# Patient Record
Sex: Female | Born: 1960 | Race: White | Hispanic: No | Marital: Married | Smoking: Former smoker
Health system: Southern US, Community
[De-identification: ages and names within clinical notes are randomized; demographics above are authoritative.]

## PROBLEM LIST (undated history)

## (undated) DIAGNOSIS — N951 Menopausal and female climacteric states: Secondary | ICD-10-CM

## (undated) DIAGNOSIS — I10 Essential (primary) hypertension: Secondary | ICD-10-CM

## (undated) DIAGNOSIS — N3000 Acute cystitis without hematuria: Secondary | ICD-10-CM

## (undated) DIAGNOSIS — E079 Disorder of thyroid, unspecified: Secondary | ICD-10-CM

## (undated) HISTORY — PX: ABDOMINAL HYSTERECTOMY: SHX81

## (undated) HISTORY — PX: BREAST SURGERY: SHX581

## (undated) HISTORY — DX: Menopausal and female climacteric states: N95.1

## (undated) HISTORY — DX: Acute cystitis without hematuria: N30.00

## (undated) HISTORY — DX: Essential (primary) hypertension: I10

## (undated) HISTORY — PX: ENDOMETRIAL ABLATION: SHX621

## (undated) HISTORY — PX: TUBAL LIGATION: SHX77

---

## 1999-09-06 ENCOUNTER — Other Ambulatory Visit: Admission: RE | Admit: 1999-09-06 | Discharge: 1999-09-06 | Payer: Self-pay | Admitting: Obstetrics and Gynecology

## 2002-06-05 HISTORY — PX: AUGMENTATION MAMMAPLASTY: SUR837

## 2002-06-19 ENCOUNTER — Other Ambulatory Visit: Admission: RE | Admit: 2002-06-19 | Discharge: 2002-06-19 | Payer: Self-pay | Admitting: Obstetrics and Gynecology

## 2003-08-27 ENCOUNTER — Other Ambulatory Visit: Admission: RE | Admit: 2003-08-27 | Discharge: 2003-08-27 | Payer: Self-pay | Admitting: Obstetrics and Gynecology

## 2004-08-29 ENCOUNTER — Other Ambulatory Visit: Admission: RE | Admit: 2004-08-29 | Discharge: 2004-08-29 | Payer: Self-pay | Admitting: Obstetrics and Gynecology

## 2004-10-07 ENCOUNTER — Encounter (INDEPENDENT_AMBULATORY_CARE_PROVIDER_SITE_OTHER): Payer: Self-pay | Admitting: *Deleted

## 2004-10-07 ENCOUNTER — Ambulatory Visit (HOSPITAL_COMMUNITY): Admission: RE | Admit: 2004-10-07 | Discharge: 2004-10-07 | Payer: Self-pay | Admitting: Obstetrics and Gynecology

## 2008-06-04 ENCOUNTER — Encounter: Admission: RE | Admit: 2008-06-04 | Discharge: 2008-06-04 | Payer: Self-pay | Admitting: Obstetrics and Gynecology

## 2009-08-09 ENCOUNTER — Encounter: Admission: RE | Admit: 2009-08-09 | Discharge: 2009-08-09 | Payer: Self-pay | Admitting: Obstetrics and Gynecology

## 2010-02-23 ENCOUNTER — Emergency Department (HOSPITAL_COMMUNITY): Admission: EM | Admit: 2010-02-23 | Discharge: 2010-02-23 | Payer: Self-pay | Admitting: Emergency Medicine

## 2010-10-21 NOTE — Op Note (Signed)
NAMETALICIA, Cindy Williamson                ACCOUNT NO.:  [id number]   MEDICAL RECORD NO.:  [id number]          PATIENT TYPE:  AMB   LOCATION:  SDC                           FACILITY:  [facility]   PHYSICIAN:  Miguel Aschoff, M.D.       DATE OF BIRTH:  Dec 06, 1960   DATE OF PROCEDURE:  10/07/2004  DATE OF DISCHARGE:                                 OPERATIVE REPORT   PREOPERATIVE DIAGNOSES:  1.  Menorrhagia.  2.  Pelvic pain.  3.  Desires sterilization.   POSTOPERATIVE DIAGNOSES:  1.  Menorrhagia.  2.  Pelvic pain.  3.  Desires sterilization.  4.  Pelvic adhesions.   PROCEDURE:  Cervical dilatation, uterine curettage, hysteroscopy, NovaSure  endometrial ablation, laparoscopy, tubal sterilization, and lysis of  adhesions.   SURGEON:  Miguel Aschoff, M.D.   ANESTHESIA:  General.   COMPLICATIONS:  None.   INDICATIONS FOR PROCEDURE:  The patient is a 50 year old white female with  history of progressively heavier menses which has not responded well to  medical therapy.  In addition, the patient reports persistent right lower  quadrant pain with a negative evaluation.  In addition, she has requested  that sterilization be performed.  Because of her symptomatology, the patient  is being taken to the operating room at this point to undergo hysteroscopy,  D&C, NovaSure endometrial ablation to control heavy bleeding followed by  laparoscopy with tubal sterilization and investigation of the pelvis to see  if an etiology for the pain can be established and corrected.  The risks and  benefits of the procedure were discussed with the patient.   DESCRIPTION OF PROCEDURE:  The patient was taken to the operating room and  placed in the supine position.  General anesthesia was administered without  difficulty.  She was then placed in the dorsal lithotomy position and  prepped and draped in the usual sterile fashion.  The bladder was  catheterized.   A speculum was placed in the vaginal vault.  The anterior  cervical lip was  grasped with a tenaculum, and the cervix was sounded to 9 cm.  The  endocervix was sounded to 4, for a cavity length of 5 cm.  After this was  done, the diagnostic hysteroscope was advanced through the cervix.  No  endocervical lesions were noted.  Investigation of the endometrial cavity  did not reveal any evidence of polyps or submucous myomas.  The hysteroscope  was then removed.  Vigorous curettage was carried out.  A moderate amount of  normal-appearing endometrial curettings were obtained and sent for  histologic study.   At this point, the NovaSure endometrial ablation unit was inserted and set  in position, with a cavity width of 4.4 cm.  Cavity assessment was then made  and was found to be sound.  At this point, a treatment cycle was carried out  for 1 minute and 6 seconds at 121 watts.  After this was done, the NovaSure  ablation unit was removed intact.  A Hulka tenaculum was then placed through  the cervix and held.   Attention was  then directed to the umbilicus.  A small infraumbilical  incision was made.  A Veress needle was inserted.  The abdomen was  insufflated with 3 L of CO2.  Following the insufflation, the trocar to  laparoscope was placed followed by laparoscope itself.  To allow complete  visualization, a 5-mm suprapubic port was established under direct  visualization.  Systematic inspection of the pelvic organs revealed the  uterus to be anterior, normal size and shape.  The left tube and ovary were  noted to be absent due to the patient's prior surgery approximately 22 years  ago.  The patient's right tube was totally within normal limits.  The ovary  was noted to be normal; however, bound down by a thick band of adhesions  pulling it into the cul-de-sac.  This was felt to be the source of the  patient's pain.  The ovary otherwise looked normal, except for this tight  adhesion holding it into the cul-de-sac.  The intestinal surfaces were   inspected and were noted to be within normal limits.  The liver was  inspected and noted to be within normal limits; however, perihepatic  adhesions were noted running from the surface of the liver to just under the  diaphragm.  At this point, the tripolar forceps were introduced.  The right  tube was grasped at its midportion, cauterized, and then divided, with good  separation and good hemostasis.  Then using the tripolar unit again, it was  possible to free the ovary from the cul-de-sac and allow it to return to a  normal position.   At this point, with no other problems being noted, it was elected to  complete the procedure.  There was excellent hemostasis.  The CO2 was  allowed to escape.  All instruments were removed.  The small incisions were  closed using subcuticular 3-0 Vicryl.  The port sites were injected with  0.25% Marcaine.  The estimated blood loss was approximately 10 to 15 cc in  total.  The patient tolerated the procedure well and went to the recovery  room in satisfactory condition.   The plan is for the patient to be discharged home.  Medications for home  will include Tylox one every three hours as needed for pain and doxycycline  100 mg twice a day x3 days.  The patient is to call if there are any  problems such as fever, pain, or heavy bleeding.  She will be seen back in  four weeks for followup examination.      AR/MEDQ  D:  10/07/2004  T:  10/07/2004  Job:  [id number]

## 2010-12-16 ENCOUNTER — Other Ambulatory Visit: Payer: Self-pay | Admitting: Obstetrics and Gynecology

## 2013-12-10 ENCOUNTER — Other Ambulatory Visit: Payer: Self-pay | Admitting: Obstetrics and Gynecology

## 2013-12-11 LAB — CYTOLOGY - PAP

## 2016-07-18 ENCOUNTER — Other Ambulatory Visit: Payer: Self-pay | Admitting: Obstetrics & Gynecology

## 2016-07-19 LAB — CYTOLOGY - PAP

## 2017-01-19 ENCOUNTER — Emergency Department (HOSPITAL_COMMUNITY)
Admission: EM | Admit: 2017-01-19 | Discharge: 2017-01-19 | Disposition: A | Discharge status: Home / Self Care | Payer: 59 | Attending: Emergency Medicine | Admitting: Emergency Medicine

## 2017-01-19 ENCOUNTER — Emergency Department (HOSPITAL_COMMUNITY): Payer: 59

## 2017-01-19 ENCOUNTER — Encounter (HOSPITAL_COMMUNITY): Payer: Self-pay | Admitting: *Deleted

## 2017-01-19 DIAGNOSIS — Y929 Unspecified place or not applicable: Secondary | ICD-10-CM | POA: Insufficient documentation

## 2017-01-19 DIAGNOSIS — W010XXA Fall on same level from slipping, tripping and stumbling without subsequent striking against object, initial encounter: Secondary | ICD-10-CM | POA: Diagnosis not present

## 2017-01-19 DIAGNOSIS — S4991XA Unspecified injury of right shoulder and upper arm, initial encounter: Secondary | ICD-10-CM | POA: Diagnosis present

## 2017-01-19 DIAGNOSIS — F17200 Nicotine dependence, unspecified, uncomplicated: Secondary | ICD-10-CM | POA: Diagnosis not present

## 2017-01-19 DIAGNOSIS — S42291A Other displaced fracture of upper end of right humerus, initial encounter for closed fracture: Secondary | ICD-10-CM

## 2017-01-19 DIAGNOSIS — Y9389 Activity, other specified: Secondary | ICD-10-CM | POA: Diagnosis not present

## 2017-01-19 DIAGNOSIS — S42201A Unspecified fracture of upper end of right humerus, initial encounter for closed fracture: Secondary | ICD-10-CM | POA: Diagnosis not present

## 2017-01-19 DIAGNOSIS — Y998 Other external cause status: Secondary | ICD-10-CM | POA: Insufficient documentation

## 2017-01-19 MED ORDER — NAPROXEN 375 MG PO TABS: 375.0000 mg | ORAL_TABLET | Freq: Two times a day (BID) | ORAL | 0 refills | Status: DC

## 2017-01-19 MED ORDER — HYDROCODONE-ACETAMINOPHEN 5-325 MG PO TABS: 1.0000 | ORAL_TABLET | Freq: Three times a day (TID) | ORAL | 0 refills | Status: AC | PRN

## 2017-01-19 MED ORDER — OXYCODONE-ACETAMINOPHEN 5-325 MG PO TABS
ORAL_TABLET | ORAL | Status: AC
  Filled 2017-01-19: qty 1

## 2017-01-19 MED ORDER — OXYCODONE-ACETAMINOPHEN 5-325 MG PO TABS
1.0000 | ORAL_TABLET | ORAL | Status: DC | PRN
  Administered 2017-01-19: 1 via ORAL

## 2017-01-19 MED ORDER — ACETAMINOPHEN 500 MG PO TABS: 1000.0000 mg | ORAL_TABLET | Freq: Three times a day (TID) | ORAL | 0 refills | Status: AC

## 2017-01-19 NOTE — ED Provider Notes (Signed)
[facility]-EMERGENCY DEPT Provider Note   CSN: [id number] Arrival date & time: 01/19/17  1000     History   Chief Complaint Chief Complaint  Patient presents with  . Shoulder Pain    Dislocated    HPI Cindy Williamson is a 56 y.o. female.  The history is provided by the patient.  Shoulder Pain   This is a new problem.   Pain began last night after a mechanical fall. Patient reports that she was walking her dog with her daughter noted a fox and started chasing her, pulling her forward and he can on her shoulder. Patient fell forward, noting immediate pain to the right shoulder. Denied any head trauma, facial trauma, loss of consciousness. No other acute injuries during the episode. It is exacerbated with palpation of the right shoulder and range of motion. Alleviated by Aleve. No other alleviating or aggravating factors. Patient denies any other physical complaints.   History reviewed. No pertinent past medical history.  There are no active problems to display for this patient.   Past Surgical History:  Procedure Laterality Date  . ABDOMINAL HYSTERECTOMY    . BREAST SURGERY      OB History    No data available       Home Medications    Prior to Admission medications   Medication Sig Start Date End Date Taking? Authorizing Provider  acetaminophen (TYLENOL) 500 MG tablet Take 2 tablets (1,000 mg total) by mouth every 8 (eight) hours. Do not take more than 4000 mg of acetaminophen (Tylenol) in a 24-hour period. Please note that other medicines that you may be prescribed may have Tylenol as well. 01/19/17 01/24/17  Nira Conn, MD  HYDROcodone-acetaminophen (NORCO/VICODIN) 5-325 MG tablet Take 1 tablet by mouth every 8 (eight) hours as needed for severe pain (That is not improved by your scheduled acetaminophen regimen). Please do not exceed 4000 mg of acetaminophen (Tylenol) a 24-hour period. Please note that he may be prescribed additional medicine that contains  acetaminophen. 01/19/17 01/24/17  Nira Conn, MD  naproxen (NAPROSYN) 375 MG tablet Take 1 tablet (375 mg total) by mouth 2 (two) times daily. 01/19/17   Nira Conn, MD    Family History No family history on file.  Social History Social History  Substance Use Topics  . Smoking status: Current Every Day Smoker  . Smokeless tobacco: Never Used  . Alcohol use Not on file     Comment: occ     Allergies   Patient has no allergy information on record.   Review of Systems Review of Systems  All other systems are reviewed and are negative for acute change except as noted in the HPI  Physical Exam Updated Vital Signs BP (!) 161/91 (BP Location: Right Arm)   Pulse 78   Temp 98.4 F (36.9 C) (Oral)   Resp 18   SpO2 98%   Physical Exam  Constitutional: She is oriented to person, place, and time. She appears well-developed and well-nourished. No distress.  HENT:  Head: Normocephalic and atraumatic.  Right Ear: External ear normal.  Left Ear: External ear normal.  Nose: Nose normal.  Eyes: Conjunctivae and EOM are normal. No scleral icterus.  Neck: Normal range of motion and phonation normal.  Cardiovascular: Normal rate and regular rhythm.   Pulmonary/Chest: Effort normal. No stridor. No respiratory distress.  Abdominal: She exhibits no distension.  Musculoskeletal: Normal range of motion. She exhibits no edema.       Right shoulder: She  exhibits tenderness and bony tenderness. She exhibits no swelling, no crepitus, no deformity, normal pulse and normal strength.  Neurological: She is alert and oriented to person, place, and time.  Skin: She is not diaphoretic.  Psychiatric: She has a normal mood and affect. Her behavior is normal.  Vitals reviewed.    ED Treatments / Results  Labs (all labs ordered are listed, but only abnormal results are displayed) Labs Reviewed - No data to display  EKG  EKG Interpretation None       Radiology Dg  Shoulder Right  Result Date: 01/19/2017 CLINICAL DATA:  Right shoulder pain secondary to a fall last night. EXAM: RIGHT SHOULDER - 2+ VIEW COMPARISON:  None. FINDINGS: There is an avulsion fracture of the greater tuberosity of the proximal right humerus with minimal displacement. No dislocation. Soft tissues appear normal. IMPRESSION: Minimally distracted fracture of the greater tuberosity of the proximal right humerus. Electronically Signed   By: Francene Boyers M.D.   On: 01/19/2017 12:48    Procedures Procedures (including critical care time)  Medications Ordered in ED Medications  oxyCODONE-acetaminophen (PERCOCET/ROXICET) 5-325 MG per tablet 1 tablet (1 tablet Oral Given 01/19/17 1154)  oxyCODONE-acetaminophen (PERCOCET/ROXICET) 5-325 MG per tablet (not administered)     Initial Impression / Assessment and Plan / ED Course  I have reviewed the triage vital signs and the nursing notes.  Pertinent labs & imaging results that were available during my care of the patient were reviewed by me and considered in my medical decision making (see chart for details).     Plain film noted a distraction injury of the right femoral head. Neurovascular intact distally. Sling applied. Patient requested follow up with [facility] orthopedic and was provided information.  The patient is safe for discharge with strict return precautions.   Final Clinical Impressions(s) / ED Diagnoses   Final diagnoses:  Closed fracture of head of right humerus, initial encounter   Disposition: Discharge  Condition: Good  I have discussed the results, Dx and Tx plan with the patient who expressed understanding and agree(s) with the plan. Discharge instructions discussed at great length. The patient was given strict return precautions who verbalized understanding of the instructions. No further questions at time of discharge.    New Prescriptions   ACETAMINOPHEN (TYLENOL) 500 MG TABLET    Take 2 tablets (1,000 mg  total) by mouth every 8 (eight) hours. Do not take more than 4000 mg of acetaminophen (Tylenol) in a 24-hour period. Please note that other medicines that you may be prescribed may have Tylenol as well.   HYDROCODONE-ACETAMINOPHEN (NORCO/VICODIN) 5-325 MG TABLET    Take 1 tablet by mouth every 8 (eight) hours as needed for severe pain (That is not improved by your scheduled acetaminophen regimen). Please do not exceed 4000 mg of acetaminophen (Tylenol) a 24-hour period. Please note that he may be prescribed additional medicine that contains acetaminophen.   NAPROXEN (NAPROSYN) 375 MG TABLET    Take 1 tablet (375 mg total) by mouth 2 (two) times daily.    Follow Up: [facility] [address] [place] [address] [place] [address] [phone]  Schedule an appointment as soon as possible for a visit in 1 week For close follow up to assess for right shoulder fracture      Dodd Schmid, Amadeo Garnet, MD 01/19/17 1436

## 2017-01-19 NOTE — ED Triage Notes (Signed)
Pt was walking dog last night and dog took off and she landed with right arm straight out on ground.  Now with pain and possible dislocation to right shoulder area.

## 2018-07-18 DIAGNOSIS — N3000 Acute cystitis without hematuria: Secondary | ICD-10-CM | POA: Insufficient documentation

## 2018-07-18 DIAGNOSIS — I10 Essential (primary) hypertension: Secondary | ICD-10-CM

## 2018-07-18 DIAGNOSIS — N951 Menopausal and female climacteric states: Secondary | ICD-10-CM | POA: Insufficient documentation

## 2018-07-18 NOTE — Progress Notes (Signed)
Cardiology Office Note   Date:  07/19/2018   ID:  Cindy Williamson, DOB Aug 10, 1960, MRN [id number]  PCP:  Patient, No Pcp Per  Cardiologist:   Annamaria Helling, MD. Referring:   Chief Complaint  Patient presents with  . Hypertension      History of Present Illness: Cindy Williamson is a 58 y.o. female who was referred by Annamaria Helling, MD for evaluation of HTN.  The patient never had high blood pressure until about a year ago she was noted to have this.  In July she was taking phentermine.  She also gained weight.  She was having some problems regulating her thyroid.  Pressure was noted to be 140/80.  However, by January of this year she was not taking this and her blood pressure was still 160/90.  She went back on the phentermine for a short period and now is stopped.  She has been started on Cozaar 50 mg daily.  She wondered if there might be some secondary cause to this.  She started exercising routinely in the fall with her husband.  She is lost weight.  They changed her diet.  She just ordered a blood pressure cuff.  She denies any cardiovascular symptoms. The patient denies any new symptoms such as chest discomfort, neck or arm discomfort. There has been no new shortness of breath, PND or orthopnea. There have been no reported palpitations, presyncope or syncope.    Past Medical History:  Diagnosis Date  . Acute cystitis   . Hypertension   . Menopausal syndrome     Past Surgical History:  Procedure Laterality Date  . ABDOMINAL HYSTERECTOMY    . BREAST SURGERY    . ENDOMETRIAL ABLATION    . TUBAL LIGATION       Current Outpatient Medications  Medication Sig Dispense Refill  . Ibuprofen (ADVIL PO) Take by mouth as needed.    Marland Kitchen losartan (COZAAR) 50 MG tablet Take 50 mg by mouth daily.    Marland Kitchen PARoxetine (PAXIL) 10 MG tablet Take 10 mg by mouth daily.    Marland Kitchen testosterone cypionate (DEPO-TESTOSTERONE) 200 MG/ML injection Inject into the muscle. Inject .25 mL every 4 weeks.    Marland Kitchen thyroid  (ARMOUR) 60 MG tablet Take 60 mg by mouth daily before breakfast.    . VITAMIN D PO Take by mouth.    . phentermine 37.5 MG capsule Take 37.5 mg by mouth every morning.     No current facility-administered medications for this visit.     Allergies:   Patient has no known allergies.    Social History:  The patient  reports that she quit smoking about 14 months ago. Her smoking use included cigarettes. She quit after 5.00 years of use. She has never used smokeless tobacco. She reports that she does not use drugs.   Family History:  The patient's family history includes Atrial fibrillation in her brother; COPD in her father.    ROS:  Please see the history of present illness.   Otherwise, review of systems are positive for none.   All other systems are reviewed and negative.    PHYSICAL EXAM: VS:  BP 122/80 (BP Location: Left Arm, Patient Position: Sitting, Cuff Size: Normal)   Pulse 64   Ht 5\' 5"  (1.651 m)   Wt 169 lb 6.4 oz (76.8 kg)   BMI 28.19 kg/m  , BMI Body mass index is 28.19 kg/m. GENERAL:  Well appearing HEENT:  Pupils equal round and reactive, fundi not  visualized, oral mucosa unremarkable NECK:  No jugular venous distention, waveform within normal limits, carotid upstroke brisk and symmetric, no bruits, no thyromegaly LYMPHATICS:  No cervical, inguinal adenopathy LUNGS:  Clear to auscultation bilaterally BACK:  No CVA tenderness CHEST:  Unremarkable HEART:  PMI not displaced or sustained,S1 and S2 within normal limits, no S3, no S4, no clicks, no rubs, no murmurs ABD:  Flat, positive bowel sounds normal in frequency in pitch, no bruits, no rebound, no guarding, no midline pulsatile mass, no hepatomegaly, no splenomegaly EXT:  2 plus pulses throughout, no edema, no cyanosis no clubbing SKIN:  No rashes no nodules NEURO:  Cranial nerves II through XII grossly intact, motor grossly intact throughout PSYCH:  Cognitively intact, oriented to person place and time    EKG:   EKG is ordered today. The ekg ordered today demonstrates sinus rhythm, rate 64, axis within normal limits, intervals within normal limits, no acute ST-T wave changes.   Recent Labs: No results found for requested labs within last 8760 hours.    Lipid Panel No results found for: CHOL, TRIG, HDL, CHOLHDL, VLDL, LDLCALC, LDLDIRECT    Wt Readings from Last 3 Encounters:  07/19/18 169 lb 6.4 oz (76.8 kg)      Other studies Reviewed: Additional studies/ records that were reviewed today include: Labs, office records. Review of the above records demonstrates:  Please see elsewhere in the note.     ASSESSMENT AND PLAN:  HTN: Her blood pressure is well controlled today.  I think her pressure will come down as she gets her weight down and increases her exercise.  She may even be able to back off on her Cozaar.  I do not suspect a secondary etiology.  Thyroid has been normal.  Electrolytes and creatinine have been normal.  She is going to start keeping a blood pressure diary and if her blood pressures remain low we will try backing off on her Cozaar.  She understands that she might chronically need an antihypertensive.  I told her I would be happy to follow-up any blood pressure diary readings should she want to send these to me.  OVERWEIGHT: She is done a good job losing weight.  We talked at length about diet and exercise.   Current medicines are reviewed at length with the patient today.  The patient does not have concerns regarding medicines.  The following changes have been made:  no change  Labs/ tests ordered today include:   Orders Placed This Encounter  Procedures  . EKG 12-Lead     Disposition:   FU with me as needed.      Signed, Rollene RotundaJames Barnell Shieh, MD  07/19/2018 11:10 AM    [facility]

## 2018-07-19 ENCOUNTER — Ambulatory Visit: Payer: 59 | Admitting: Cardiology

## 2018-07-19 ENCOUNTER — Encounter: Payer: Self-pay | Admitting: Cardiology

## 2018-07-19 VITALS — BP 122/80 | HR 64 | Ht 65.0 in | Wt 169.4 lb

## 2018-07-19 DIAGNOSIS — I10 Essential (primary) hypertension: Secondary | ICD-10-CM

## 2018-07-19 NOTE — Patient Instructions (Addendum)
Medication Instructions:  Continue current medications  If you need a refill on your cardiac medications before your next appointment, please call your pharmacy.  Labwork: None Ordered   Take the provided lab slips with you to the lab for your blood draw.   When you have your labs (blood work) drawn today and your tests are completely normal, you will receive your results only by MyChart Message (if you have MyChart) -OR-  A paper copy in the mail.  If you have any lab test that is abnormal or we need to change your treatment, we will call you to review these results.  Testing/Procedures: None Ordered  Follow-Up: . Your physician recommends that you schedule a follow-up appointment in: As Needed   At [facility], you and your health needs are our priority.  As part of our continuing mission to provide you with exceptional heart care, we have created designated Provider Care Teams.  These Care Teams include your primary Cardiologist (physician) and Advanced Practice Providers (APPs -  Physician Assistants and Nurse Practitioners) who all work together to provide you with the care you need, when you need it.  Thank you for choosing [facility] at [facility]!!    Fulton Reek, MD [phone]

## 2019-07-03 ENCOUNTER — Other Ambulatory Visit: Payer: Self-pay | Admitting: Obstetrics & Gynecology

## 2019-07-03 DIAGNOSIS — N644 Mastodynia: Secondary | ICD-10-CM

## 2019-07-16 ENCOUNTER — Ambulatory Visit
Admission: RE | Admit: 2019-07-16 | Discharge: 2019-07-16 | Disposition: A | Discharge status: Home / Self Care | Payer: 59 | Source: Ambulatory Visit | Attending: Obstetrics & Gynecology | Admitting: Obstetrics & Gynecology

## 2019-07-16 ENCOUNTER — Other Ambulatory Visit: Payer: Self-pay | Admitting: Obstetrics & Gynecology

## 2019-07-16 ENCOUNTER — Other Ambulatory Visit: Payer: Self-pay

## 2019-07-16 DIAGNOSIS — N644 Mastodynia: Secondary | ICD-10-CM

## 2019-12-11 DIAGNOSIS — Z7189 Other specified counseling: Secondary | ICD-10-CM | POA: Insufficient documentation

## 2019-12-11 DIAGNOSIS — E663 Overweight: Secondary | ICD-10-CM | POA: Insufficient documentation

## 2019-12-11 NOTE — Progress Notes (Deleted)
Cardiology Office Note   Date:  12/11/2019   ID:  [name], DOB 14-Nov-1960, MRN [id number]  PCP:  Patient, No Pcp Per  Cardiologist:   No ref. provider found. Referring:   No chief complaint on file.     History of Present Illness: Cindy Williamson is a 59 y.o. female who was referred for evaluation of HTN.   ***   ***  The patient never had high blood pressure until about a year ago she was noted to have this.  In July she was taking phentermine.  She also gained weight.  She was having some problems regulating her thyroid.  Pressure was noted to be 140/80.  However, by January of this year she was not taking this and her blood pressure was still 160/90.  She went back on the phentermine for a short period and now is stopped.  She has been started on Cozaar 50 mg daily.  She wondered if there might be some secondary cause to this.  She started exercising routinely in the fall with her husband.  She is lost weight.  They changed her diet.  She just ordered a blood pressure cuff.  She denies any cardiovascular symptoms. The patient denies any new symptoms such as chest discomfort, neck or arm discomfort. There has been no new shortness of breath, PND or orthopnea. There have been no reported palpitations, presyncope or syncope.    Past Medical History:  Diagnosis Date  . Acute cystitis   . Hypertension   . Menopausal syndrome     Past Surgical History:  Procedure Laterality Date  . ABDOMINAL HYSTERECTOMY    . AUGMENTATION MAMMAPLASTY Bilateral 2004  . BREAST SURGERY    . ENDOMETRIAL ABLATION    . TUBAL LIGATION       Current Outpatient Medications  Medication Sig Dispense Refill  . Ibuprofen (ADVIL PO) Take by mouth as needed.    Marland Kitchen losartan (COZAAR) 50 MG tablet Take 50 mg by mouth daily.    Marland Kitchen PARoxetine (PAXIL) 10 MG tablet Take 10 mg by mouth daily.    . phentermine 37.5 MG capsule Take 37.5 mg by mouth every morning.    . testosterone cypionate (DEPO-TESTOSTERONE) 200  MG/ML injection Inject into the muscle. Inject .25 mL every 4 weeks.    Marland Kitchen thyroid (ARMOUR) 60 MG tablet Take 60 mg by mouth daily before breakfast.    . VITAMIN D PO Take by mouth.     No current facility-administered medications for this visit.    Allergies:   Patient has no known allergies.    ROS:  Please see the history of present illness.   Otherwise, review of systems are positive for ***.   All other systems are reviewed and negative.    PHYSICAL EXAM: VS:  There were no vitals taken for this visit. , BMI There is no height or weight on file to calculate BMI. GENERAL:  Well appearing NECK:  No jugular venous distention, waveform within normal limits, carotid upstroke brisk and symmetric, no bruits, no thyromegaly LUNGS:  Clear to auscultation bilaterally CHEST:  Unremarkable HEART:  PMI not displaced or sustained,S1 and S2 within normal limits, no S3, no S4, no clicks, no rubs, *** murmurs ABD:  Flat, positive bowel sounds normal in frequency in pitch, no bruits, no rebound, no guarding, no midline pulsatile mass, no hepatomegaly, no splenomegaly EXT:  2 plus pulses throughout, no edema, no cyanosis no clubbing   ***GENERAL:  Well appearing HEENT:  Pupils equal round and reactive, fundi not visualized, oral mucosa unremarkable NECK:  No jugular venous distention, waveform within normal limits, carotid upstroke brisk and symmetric, no bruits, no thyromegaly LYMPHATICS:  No cervical, inguinal adenopathy LUNGS:  Clear to auscultation bilaterally BACK:  No CVA tenderness CHEST:  Unremarkable HEART:  PMI not displaced or sustained,S1 and S2 within normal limits, no S3, no S4, no clicks, no rubs, no murmurs ABD:  Flat, positive bowel sounds normal in frequency in pitch, no bruits, no rebound, no guarding, no midline pulsatile mass, no hepatomegaly, no splenomegaly EXT:  2 plus pulses throughout, no edema, no cyanosis no clubbing SKIN:  No rashes no nodules NEURO:  Cranial nerves  II through XII grossly intact, motor grossly intact throughout PSYCH:  Cognitively intact, oriented to person place and time    EKG:  EKG is *** ordered today. The ekg ordered today demonstrates sinus rhythm, rate ***, axis within normal limits, intervals within normal limits, no acute ST-T wave changes.   Recent Labs: No results found for requested labs within last 8760 hours.    Lipid Panel No results found for: CHOL, TRIG, HDL, CHOLHDL, VLDL, LDLCALC, LDLDIRECT    Wt Readings from Last 3 Encounters:  07/19/18 169 lb 6.4 oz (76.8 kg)      Other studies Reviewed: Additional studies/ records that were reviewed today include: *** Review of the above records demonstrates:  Please see elsewhere in the note.     ASSESSMENT AND PLAN:  HTN: Her blood pressure is *** well controlled today.  I think her pressure will come down as she gets her weight down and increases her exercise.  She may even be able to back off on her Cozaar.  I do not suspect a secondary etiology.  Thyroid has been normal.  Electrolytes and creatinine have been normal.  She is going to start keeping a blood pressure diary and if her blood pressures remain low we will try backing off on her Cozaar.  She understands that she might chronically need an antihypertensive.  I told her I would be happy to follow-up any blood pressure diary readings should she want to send these to me.  OVERWEIGHT: ***  She is done a good job losing weight.  We talked at length about diet and exercise.  COVID EDUCATION:  ***    Current medicines are reviewed at length with the patient today.  The patient does not have concerns regarding medicines.  The following changes have been made:  ***  Labs/ tests ordered today include: ***  No orders of the defined types were placed in this encounter.    Disposition:   FU with me ***   Signed, Rollene Rotunda, MD  12/11/2019 8:49 PM    [facility]

## 2019-12-12 ENCOUNTER — Ambulatory Visit: Payer: 59 | Admitting: Cardiology

## 2020-01-22 NOTE — Progress Notes (Signed)
Cardiology Office Note   Date:  01/23/2020   ID:  Cindy Williamson, DOB [birth year], MRN [id number]  PCP:  Patient, No Pcp Per  Cardiologist:   No ref. provider found. Referring:   Chief Complaint  Patient presents with  . Chest Pain      History of Present Illness: Cindy Williamson is a 59 y.o. female who follows up for evaluation of HTN.  She has had well-controlled blood pressures.  She has had some sporadic chest pain.  It comes and goes.  It lasts for seconds.  It is a heaviness.  Its intermittent chest.  It does not radiate to the neck or to the arms.  There is no associated nausea vomiting or diaphoresis.  She has no new shortness of breath, PND or orthopnea.  She does go to the gym and exercises routinely and this does not bring on the symptoms.  She has been worried because recent people she is known to have had sudden cardiac events.   Past Medical History:  Diagnosis Date  . Acute cystitis   . Hypertension   . Menopausal syndrome     Past Surgical History:  Procedure Laterality Date  . ABDOMINAL HYSTERECTOMY    . AUGMENTATION MAMMAPLASTY Bilateral 2004  . BREAST SURGERY    . ENDOMETRIAL ABLATION    . TUBAL LIGATION       Current Outpatient Medications  Medication Sig Dispense Refill  . Ibuprofen (ADVIL PO) Take by mouth as needed.    Marland Kitchen levothyroxine (SYNTHROID) 100 MCG tablet Take 100 mcg by mouth at bedtime.    Marland Kitchen losartan (COZAAR) 50 MG tablet Take 50 mg by mouth daily.    Marland Kitchen PARoxetine (PAXIL) 10 MG tablet Take 10 mg by mouth daily.    Marland Kitchen VITAMIN D PO Take by mouth.     No current facility-administered medications for this visit.    Allergies:   Patient has no known allergies.    ROS:  Please see the history of present illness.   Otherwise, review of systems are positive for none.   All other systems are reviewed and negative.    PHYSICAL EXAM: VS:  BP 132/74   Pulse 68   Ht 5\' 5"  (1.651 m)   Wt 195 lb 3.2 oz (88.5 kg)   SpO2 98%   BMI 32.48 kg/m  ,  BMI Body mass index is 32.48 kg/m. GENERAL:  Well appearing NECK:  No jugular venous distention, waveform within normal limits, carotid upstroke brisk and symmetric, no bruits, no thyromegaly LUNGS:  Clear to auscultation bilaterally CHEST:  Unremarkable HEART:  PMI not displaced or sustained,S1 and S2 within normal limits, no S3, no S4, no clicks, no rubs, no murmurs ABD:  Flat, positive bowel sounds normal in frequency in pitch, no bruits, no rebound, no guarding, no midline pulsatile mass, no hepatomegaly, no splenomegaly EXT:  2 plus pulses throughout, no edema, no cyanosis no clubbing   EKG:  EKG is  ordered today. The ekg ordered today demonstrates sinus rhythm, rate 66, axis within normal limits, intervals within normal limits, no acute ST-T wave changes.   Recent Labs: No results found for requested labs within last 8760 hours.    Lipid Panel No results found for: CHOL, TRIG, HDL, CHOLHDL, VLDL, LDLCALC, LDLDIRECT    Wt Readings from Last 3 Encounters:  01/23/20 195 lb 3.2 oz (88.5 kg)  07/19/18 169 lb 6.4 oz (76.8 kg)      Other studies Reviewed: Additional studies/  records that were reviewed today include: Labs, office records. Review of the above records demonstrates:  Please see elsewhere in the note.     ASSESSMENT AND PLAN:  HTN:   Her blood pressure is well controlled on the Cozaar.  No change in therapy.  CHEST PAIN: She has some atypical chest pain.  She does have some cardiovascular risk factors.  I will start with a screening test with a coronary calcium score.  I will have a low threshold for treadmill testing.  We talked about risk reduction.  COVID EDUCATION: She has not been vaccinated but she is going to be and we talked about this and the choices.  She did have positive antibodies with Covid early on.   Current medicines are reviewed at length with the patient today.  The patient does not have concerns regarding medicines.  The following changes  have been made:  None  Labs/ tests ordered today include:   Orders Placed This Encounter  Procedures  . CT CARDIAC SCORING  . EKG 12-Lead     Disposition:   FU with me in one year.    Signed, Rollene Rotunda, MD  01/23/2020 9:17 AM     [facility]

## 2020-01-23 ENCOUNTER — Encounter: Payer: Self-pay | Admitting: Cardiology

## 2020-01-23 ENCOUNTER — Other Ambulatory Visit: Payer: Self-pay

## 2020-01-23 ENCOUNTER — Ambulatory Visit: Payer: 59 | Admitting: Cardiology

## 2020-01-23 VITALS — BP 132/74 | HR 68 | Ht 65.0 in | Wt 195.2 lb

## 2020-01-23 DIAGNOSIS — R079 Chest pain, unspecified: Secondary | ICD-10-CM

## 2020-01-23 DIAGNOSIS — E663 Overweight: Secondary | ICD-10-CM | POA: Diagnosis not present

## 2020-01-23 DIAGNOSIS — Z7189 Other specified counseling: Secondary | ICD-10-CM

## 2020-01-23 DIAGNOSIS — I10 Essential (primary) hypertension: Secondary | ICD-10-CM | POA: Diagnosis not present

## 2020-01-23 NOTE — Patient Instructions (Signed)
Medication Instructions:  No changes *If you need a refill on your cardiac medications before your next appointment, please call your pharmacy*  Lab Work: None ordered this visit  Testing/Procedures: Coronary Calcium Score  Follow-Up: At [facility], you and your health needs are our priority.  As part of our continuing mission to provide you with exceptional heart care, we have created designated Provider Care Teams.  These Care Teams include your primary Cardiologist (physician) and Advanced Practice Providers (APPs -  Physician Assistants and Nurse Practitioners) who all work together to provide you with the care you need, when you need it.  Your next appointment:   12 month(s)   You will receive a reminder letter in the mail two months in advance. If you don't receive a letter, please call our office to schedule the follow-up appointment.  The format for your next appointment:   In Person  Provider:   Rollene Rotunda, MD  Other Instructions Dr. Antoine Poche has ordered a CT coronary calcium score. This test is done at [address]. This is $150 out of pocket. Coronary CalciumScan A coronary calcium scan is an imaging test used to look for deposits of calcium and other fatty materials (plaques) in the inner lining of the blood vessels of the heart (coronary arteries). These deposits of calcium and plaques can partly clog and narrow the coronary arteries without producing any symptoms or warning signs. This puts a person at risk for a heart attack. This test can detect these deposits before symptoms develop. Tell a health care provider about:  Any allergies you have.  All medicines you are taking, including vitamins, herbs, eye drops, creams, and over-the-counter medicines.  Any problems you or family members have had with anesthetic medicines.  Any blood disorders you have.  Any surgeries you have had.  Any medical conditions you have.  Whether you are  pregnant or may be pregnant. What are the risks? Generally, this is a safe procedure. However, problems may occur, including:  Harm to a pregnant woman and her unborn baby. This test involves the use of radiation. Radiation exposure can be dangerous to a pregnant woman and her unborn baby. If you are pregnant, you generally should not have this procedure done.  Slight increase in the risk of cancer. This is because of the radiation involved in the test. What happens before the procedure? No preparation is needed for this procedure. What happens during the procedure?  You will undress and remove any jewelry around your neck or chest.  You will put on a hospital gown.  Sticky electrodes will be placed on your chest. The electrodes will be connected to an electrocardiogram (ECG) machine to record a tracing of the electrical activity of your heart.  A CT scanner will take pictures of your heart. During this time, you will be asked to lie still and hold your breath for 2-3 seconds while a picture of your heart is being taken. The procedure may vary among health care providers and hospitals. What happens after the procedure?  You can get dressed.  You can return to your normal activities.  It is up to you to get the results of your test. Ask your health care provider, or the department that is doing the test, when your results will be ready. Summary  A coronary calcium scan is an imaging test used to look for deposits of calcium and other fatty materials (plaques) in the inner lining of the blood vessels  of the heart (coronary arteries).  Generally, this is a safe procedure. Tell your health care provider if you are pregnant or may be pregnant.  No preparation is needed for this procedure.  A CT scanner will take pictures of your heart.  You can return to your normal activities after the scan is done. This information is not intended to replace advice given to you by your health care  provider. Make sure you discuss any questions you have with your health care provider. Document Released: 11/18/2007 Document Revised: 04/10/2016 Document Reviewed: 04/10/2016 Elsevier Interactive Patient Education  2017 [employer].

## 2020-01-26 ENCOUNTER — Other Ambulatory Visit: Payer: Self-pay

## 2020-01-26 ENCOUNTER — Ambulatory Visit (INDEPENDENT_AMBULATORY_CARE_PROVIDER_SITE_OTHER)
Admission: RE | Admit: 2020-01-26 | Discharge: 2020-01-26 | Disposition: A | Discharge status: Home / Self Care | Payer: Self-pay | Source: Ambulatory Visit | Attending: Cardiology | Admitting: Cardiology

## 2020-01-26 DIAGNOSIS — R079 Chest pain, unspecified: Secondary | ICD-10-CM

## 2020-01-26 DIAGNOSIS — I10 Essential (primary) hypertension: Secondary | ICD-10-CM

## 2020-02-02 ENCOUNTER — Telehealth: Payer: Self-pay | Admitting: *Deleted

## 2020-02-02 DIAGNOSIS — R918 Other nonspecific abnormal finding of lung field: Secondary | ICD-10-CM

## 2020-02-02 NOTE — Telephone Encounter (Signed)
-----   Message from Rollene Rotunda, MD sent at 02/01/2020  6:29 PM EDT ----- No evidence of elevated coronary calcium.   She has small non specific pulmonary nodules that are thought to be benign.  However, she did have a brief smoking history in the distant past and I might suggest a one year follow up CT without contrast to follow up.  Please call with results and schedule CT.

## 2020-02-02 NOTE — Telephone Encounter (Signed)
Advised patient, order placed, and sent to scheduling to arrange

## 2020-09-27 ENCOUNTER — Other Ambulatory Visit: Payer: Self-pay

## 2020-09-27 ENCOUNTER — Emergency Department (HOSPITAL_COMMUNITY): Payer: 59

## 2020-09-27 ENCOUNTER — Encounter (HOSPITAL_COMMUNITY): Payer: Self-pay

## 2020-09-27 ENCOUNTER — Emergency Department (HOSPITAL_COMMUNITY)
Admission: EM | Admit: 2020-09-27 | Discharge: 2020-09-28 | Disposition: A | Discharge status: Home / Self Care | Payer: 59 | Attending: Emergency Medicine | Admitting: Emergency Medicine

## 2020-09-27 DIAGNOSIS — I4891 Unspecified atrial fibrillation: Secondary | ICD-10-CM

## 2020-09-27 DIAGNOSIS — I10 Essential (primary) hypertension: Secondary | ICD-10-CM | POA: Insufficient documentation

## 2020-09-27 DIAGNOSIS — Z8616 Personal history of COVID-19: Secondary | ICD-10-CM | POA: Diagnosis not present

## 2020-09-27 DIAGNOSIS — Z87891 Personal history of nicotine dependence: Secondary | ICD-10-CM | POA: Diagnosis not present

## 2020-09-27 DIAGNOSIS — E079 Disorder of thyroid, unspecified: Secondary | ICD-10-CM | POA: Diagnosis not present

## 2020-09-27 DIAGNOSIS — Z79899 Other long term (current) drug therapy: Secondary | ICD-10-CM | POA: Insufficient documentation

## 2020-09-27 DIAGNOSIS — R002 Palpitations: Secondary | ICD-10-CM | POA: Diagnosis present

## 2020-09-27 HISTORY — DX: Disorder of thyroid, unspecified: E07.9

## 2020-09-27 LAB — BASIC METABOLIC PANEL
Anion gap: 13 (ref 5–15)
BUN: 15 mg/dL (ref 6–20)
CO2: 21 mmol/L — ABNORMAL LOW (ref 22–32)
Calcium: 9.6 mg/dL (ref 8.9–10.3)
Chloride: 105 mmol/L (ref 98–111)
Creatinine, Ser: 0.83 mg/dL (ref 0.44–1.00)
GFR, Estimated: >60 mL/min (ref >60)
Glucose, Bld: 104 mg/dL — ABNORMAL HIGH (ref 70–99)
Potassium: 3.7 mmol/L (ref 3.5–5.1)
Sodium: 139 mmol/L (ref 135–145)

## 2020-09-27 LAB — CBC
HCT: 43.3 % (ref 36.0–46.0)
Hemoglobin: 14.4 g/dL (ref 12.0–15.0)
MCH: 31.2 pg (ref 26.0–34.0)
MCHC: 33.3 g/dL (ref 30.0–36.0)
MCV: 93.9 fL (ref 80.0–100.0)
Platelets: 352 10*3/uL (ref 150–400)
RBC: 4.61 MIL/uL (ref 3.87–5.11)
RDW: 12.9 % (ref 11.5–15.5)
WBC: 6.8 10*3/uL (ref 4.0–10.5)
nRBC: 0 % (ref 0.0–0.2)

## 2020-09-27 LAB — TSH: TSH: 6.738 u[IU]/mL — ABNORMAL HIGH (ref 0.350–4.500)

## 2020-09-27 LAB — I-STAT BETA HCG BLOOD, ED (MC, WL, AP ONLY): I-stat hCG, quantitative: <5 m[IU]/mL (ref <5)

## 2020-09-27 LAB — MAGNESIUM: Magnesium: 1.9 mg/dL (ref 1.7–2.4)

## 2020-09-27 LAB — ETHANOL: Alcohol, Ethyl (B): <10 mg/dL (ref <10)

## 2020-09-27 MED ORDER — SODIUM CHLORIDE 0.9 % IV BOLUS
1000.0000 mL | Freq: Once | INTRAVENOUS | Status: AC
  Administered 2020-09-28: 1000 mL via INTRAVENOUS

## 2020-09-27 MED ORDER — SODIUM CHLORIDE 0.9 % IV SOLN: INTRAVENOUS | Status: DC

## 2020-09-27 MED ORDER — DILTIAZEM LOAD VIA INFUSION
20.0000 mg | Freq: Once | INTRAVENOUS | Status: DC
  Filled 2020-09-27: qty 20

## 2020-09-27 MED ORDER — DILTIAZEM HCL-DEXTROSE 125-5 MG/125ML-% IV SOLN (PREMIX): 5.0000 mg/h | INTRAVENOUS | Status: DC

## 2020-09-27 MED ORDER — SODIUM CHLORIDE 0.9 % IV BOLUS
1000.0000 mL | Freq: Once | INTRAVENOUS | Status: AC
  Administered 2020-09-27: 1000 mL via INTRAVENOUS

## 2020-09-27 NOTE — ED Provider Notes (Incomplete)
[facility] EMERGENCY DEPARTMENT Provider Note   CSN: [id number] Arrival date & time: 09/27/20  2219     History Chief Complaint  Patient presents with  . A-fib RVR   Cindy Williamson is a 60 y.o. female with history of Hashimoto's thyroiditis on levothyroxine, HTN presents to ER by EMS from home for evaluation of palpitations and possible a-fib with RVR. Patient had burger and chili.  1.5 hours later had sudden onset lower abdominal cramping, diarrhea. Symptoms resolved completely. Laid down and felt sudden onset chest tightness and palpitations that lasted a few seconds. Symptoms completely resolved, asymptomatic now. Fire gave 324 ASA and 30 mg Cardizem.   Reports no history of arrhythmias. She drinks at least 2 beers daily. Went to a concert through the weekend and drank heavily Saturday night, approximately a twelve pack of beer. States she is probably dehydrated.  Drinks 2 zero Dr pepper daily. No significant caffeine intake. Denies recreational or illicit drug use. She has her thyroid levels checked 1 month ago and were normal. Denies recent illness. No longer having CP. No associated shortness of breath, light headedness, syncope. No recent leg swelling, calf pain, hemoptysis. No history of DVT or PE. She is not very active but states has not had any exertional chest pain in the past. Patient HR is 150-160 now and she is asymptomatic. Reports having occasional palpitations through the last few months/years occasionally.   HPI     Past Medical History:  Diagnosis Date  . Acute cystitis   . Hypertension   . Menopausal syndrome   . Thyroid disease     Patient Active Problem List   Diagnosis Date Noted  . Educated about COVID-19 virus infection 12/11/2019  . Overweight 12/11/2019  . Acute cystitis 07/18/2018  . Menopausal syndrome 07/18/2018  . Essential hypertension 07/18/2018    Past Surgical History:  Procedure Laterality Date  . ABDOMINAL HYSTERECTOMY    .  AUGMENTATION MAMMAPLASTY Bilateral 2004  . BREAST SURGERY    . ENDOMETRIAL ABLATION    . TUBAL LIGATION       OB History   No obstetric history on file.     Family History  Problem Relation Age of Onset  . COPD Father   . Atrial fibrillation Brother     Social History   Tobacco Use  . Smoking status: Former Smoker    Years: 5.00    Types: Cigarettes    Quit date: 04/22/2017    Years since quitting: 3.4  . Smokeless tobacco: Never Used  Substance Use Topics  . Drug use: No    Home Medications Prior to Admission medications   Medication Sig Start Date End Date Taking? Authorizing Provider  Ibuprofen (ADVIL PO) Take by mouth as needed.    [provider]  levothyroxine (SYNTHROID) 100 MCG tablet Take 100 mcg by mouth at bedtime.    [provider]  losartan (COZAAR) 50 MG tablet Take 50 mg by mouth daily.    [provider]  PARoxetine (PAXIL) 10 MG tablet Take 10 mg by mouth daily.    [provider]  VITAMIN D PO Take by mouth.    [provider]    Allergies    Penicillins  Review of Systems   Review of Systems  Physical Exam Updated Vital Signs BP (!) 142/93   Pulse (!) 152   Temp 98.8 F (37.1 C) (Oral)   Resp 19   Ht 5\' 5"  (1.651 m)  Wt 88.5 kg   SpO2 100%   BMI 32.45 kg/m   Physical Exam  ED Results / Procedures / Treatments   Labs (all labs ordered are listed, but only abnormal results are displayed) Labs Reviewed  BASIC METABOLIC PANEL - Abnormal; Notable for the following components:      Result Value   CO2 21 (*)    Glucose, Bld 104 (*)    All other components within normal limits  CBC  TSH  MAGNESIUM  ETHANOL  I-STAT BETA HCG BLOOD, ED ([facility], [facility], [facility] ONLY)    EKG EKG Interpretation  Date/Time:  Monday September 27 2020 22:30:22 EDT Ventricular Rate:  152 PR Interval:  160 QRS Duration: 91 QT Interval:  333 QTC Calculation: 530 R Axis:   49 Text Interpretation: rhythm indeterminate  Low voltage, precordial leads RSR' in V1 or V2, right VCD or RVH Nonspecific T abnormalities, lateral leads Prolonged QT interval Confirmed by Zadie Rhine ([id number]) on 09/27/2020 11:09:52 PM   Radiology DG Chest Port 1 View  Result Date: 09/27/2020 CLINICAL DATA:  Atrial fibrillation with RVR. EXAM: PORTABLE CHEST 1 VIEW COMPARISON:  Included portion from cardiac CT 01/26/2020. FINDINGS: The cardiomediastinal contours are normal. Small nodules in the right mid lung corresponds to pulmonary nodules on prior cardiac CT. Pulmonary vasculature is normal. No consolidation, pleural effusion, or pneumothorax. No acute osseous abnormalities are seen. IMPRESSION: No acute abnormality. Electronically Signed   By: Narda Rutherford M.D.   On: 09/27/2020 23:20    Procedures Procedures {Remember to document critical care time when appropriate:1}  Medications Ordered in ED Medications  sodium chloride 0.9 % bolus 1,000 mL (1,000 mLs Intravenous New Bag/Given 09/27/20 2316)    And  0.9 %  sodium chloride infusion (has no administration in time range)    ED Course  I have reviewed the triage vital signs and the nursing notes.  Pertinent labs & imaging results that were available during my care of the patient were reviewed by me and considered in my medical decision making (see chart for details).  Clinical Course as of 09/27/20 2325  Mon Sep 27, 2020  2244 EKG 12-Lead EKG discussed with EDP Hong aflutter 2:1 vs rapid ST, favoring flutter [CG]    Clinical Course User Index [CG] Jerrell Mylar   MDM Rules/Calculators/A&P                          *** Final Clinical Impression(s) / ED Diagnoses Final diagnoses:  A-fib [facility])    Rx / DC Orders ED Discharge Orders         Ordered    Amb referral to AFIB Clinic        09/27/20 2257

## 2020-09-27 NOTE — ED Provider Notes (Signed)
[facility] EMERGENCY DEPARTMENT Provider Note   CSN: [id number] Arrival date & time: 09/27/20  2219     History Chief Complaint  Patient presents with  . A-fib RVR   Cindy Williamson is a 60 y.o. female with history of Hashimoto's thyroiditis on levothyroxine, HTN presents to ER by EMS from home for evaluation of palpitations and possible a-fib with RVR. Patient had burger and chili.  1.5 hours later had sudden onset lower abdominal cramping, diarrhea. Symptoms resolved completely. Laid down and felt sudden onset chest tightness and palpitations that lasted a few seconds. Symptoms completely resolved, asymptomatic now. Fire gave 324 ASA and 30 mg Cardizem.   Reports no history of arrhythmias. She drinks at least 2 beers daily. Went to a concert through the weekend and drank heavily Saturday night, approximately a twelve pack of beer. States she is probably dehydrated.  Drinks 2 zero Dr pepper daily. No significant caffeine intake. Denies recreational or illicit drug use. She has her thyroid levels checked 1 month ago and were normal. Denies recent illness. No longer having CP. No associated shortness of breath, light headedness, syncope. No recent leg swelling, calf pain, hemoptysis. No history of DVT or PE. She is not very active but states has not had any exertional chest pain in the past. Patient HR is 150-160 now and she is asymptomatic. Reports having occasional palpitations through the last few months/years occasionally.   HPI     Past Medical History:  Diagnosis Date  . Acute cystitis   . Hypertension   . Menopausal syndrome   . Thyroid disease     Patient Active Problem List   Diagnosis Date Noted  . Educated about COVID-19 virus infection 12/11/2019  . Overweight 12/11/2019  . Acute cystitis 07/18/2018  . Menopausal syndrome 07/18/2018  . Essential hypertension 07/18/2018    Past Surgical History:  Procedure Laterality Date  . ABDOMINAL HYSTERECTOMY    .  AUGMENTATION MAMMAPLASTY Bilateral 2004  . BREAST SURGERY    . ENDOMETRIAL ABLATION    . TUBAL LIGATION       OB History   No obstetric history on file.     Family History  Problem Relation Age of Onset  . COPD Father   . Atrial fibrillation Brother     Social History   Tobacco Use  . Smoking status: Former Smoker    Years: 5.00    Types: Cigarettes    Quit date: 04/22/2017    Years since quitting: 3.4  . Smokeless tobacco: Never Used  Substance Use Topics  . Drug use: No    Home Medications Prior to Admission medications   Medication Sig Start Date End Date Taking? Authorizing Provider  rivaroxaban (XARELTO) 20 MG TABS tablet Take 1 tablet (20 mg total) by mouth daily with supper. 09/28/20  Yes Carmon Sails J, PA-C  Ibuprofen (ADVIL PO) Take by mouth as needed.    [provider]  levothyroxine (SYNTHROID) 100 MCG tablet Take 100 mcg by mouth at bedtime.    [provider]  losartan (COZAAR) 50 MG tablet Take 50 mg by mouth daily.    [provider]  PARoxetine (PAXIL) 10 MG tablet Take 10 mg by mouth daily.    [provider]  VITAMIN D PO Take by mouth.    [provider]    Allergies    Penicillins  Review of Systems   Review of Systems  Respiratory: Positive for chest tightness.   Cardiovascular: Positive  for palpitations.  All other systems reviewed and are negative.   Physical Exam Updated Vital Signs BP 129/76 (BP Location: Right Arm)   Pulse 69   Temp 98.8 F (37.1 C) (Oral)   Resp 15   Ht '5\' 5"'  (1.651 m)   Wt 88.5 kg   SpO2 100%   BMI 32.45 kg/m   Physical Exam Constitutional:      Appearance: She is well-developed.     Comments: NAD. Non toxic.   HENT:     Head: Normocephalic and atraumatic.     Nose: Nose normal.  Eyes:     General: Lids are normal.     Conjunctiva/sclera: Conjunctivae normal.  Neck:     Trachea: Trachea normal.     Comments: Trachea midline.  Cardiovascular:      Rate and Rhythm: Tachycardia present. Rhythm irregular.     Pulses:          Radial pulses are 1+ on the right side and 1+ on the left side.       Dorsalis pedis pulses are 1+ on the right side and 1+ on the left side.     Heart sounds: Normal heart sounds, S1 normal and S2 normal.     Comments: HR 150s during encounter. No murmurs. No LE edema or calf tenderness.  Pulmonary:     Effort: Pulmonary effort is normal.     Breath sounds: Normal breath sounds.  Chest:     Comments: No chest wall tenderness Abdominal:     General: Bowel sounds are normal.     Palpations: Abdomen is soft.     Tenderness: There is no abdominal tenderness.     Comments: No epigastric or upper abdominal tenderness.  Musculoskeletal:     Cervical back: Normal range of motion.  Skin:    General: Skin is warm and dry.     Capillary Refill: Capillary refill takes less than 2 seconds.     Comments: No rash to chest wall  Neurological:     Mental Status: She is alert.     GCS: GCS eye subscore is 4. GCS verbal subscore is 5. GCS motor subscore is 6.     Comments: Sensation and strength intact in upper/lower extremities  Psychiatric:        Speech: Speech normal.        Behavior: Behavior normal.        Thought Content: Thought content normal.     ED Results / Procedures / Treatments   Labs (all labs ordered are listed, but only abnormal results are displayed) Labs Reviewed  BASIC METABOLIC PANEL - Abnormal; Notable for the following components:      Result Value   CO2 21 (*)    Glucose, Bld 104 (*)    All other components within normal limits  TSH - Abnormal; Notable for the following components:   TSH 6.738 (*)    All other components within normal limits  CBC  MAGNESIUM  ETHANOL  I-STAT BETA HCG BLOOD, ED ([facility], [facility], [facility] ONLY)    EKG EKG Interpretation  Date/Time:  Monday September 27 2020 23:30:03 EDT Ventricular Rate:  85 PR Interval:  124 QRS Duration: 117 QT Interval:  369 QTC  Calculation: 439 R Axis:   74 Text Interpretation: Sinus rhythm Nonspecific intraventricular conduction delay Minimal ST depression, diffuse leads Confirmed by Ripley Fraise [phone]) on 09/27/2020 11:35:12 PM   Radiology DG Chest Port 1 View  Result Date: 09/27/2020 CLINICAL DATA:  Atrial fibrillation with RVR. EXAM: PORTABLE CHEST 1 VIEW COMPARISON:  Included portion from cardiac CT 01/26/2020. FINDINGS: The cardiomediastinal contours are normal. Small nodules in the right mid lung corresponds to pulmonary nodules on prior cardiac CT. Pulmonary vasculature is normal. No consolidation, pleural effusion, or pneumothorax. No acute osseous abnormalities are seen. IMPRESSION: No acute abnormality. Electronically Signed   By: Keith Rake M.D.   On: 09/27/2020 23:20    Procedures Procedures   Medications Ordered in ED Medications  rivaroxaban (XARELTO) tablet 20 mg (20 mg Oral Given 09/28/20 0142)  sodium chloride 0.9 % bolus 1,000 mL (0 mLs Intravenous Stopped 09/28/20 0009)  sodium chloride 0.9 % bolus 1,000 mL (0 mLs Intravenous Stopped 09/28/20 0143)  rivaroxaban Alveda Reasons) Education Kit for Afib patients ( Does not apply Given 09/28/20 0145)    ED Course  I have reviewed the triage vital signs and the nursing notes.  Pertinent labs & imaging results that were available during my care of the patient were reviewed by me and considered in my medical decision making (see chart for details).  Clinical Course as of 09/28/20 0708  Mon Sep 27, 2020  2244 EKG 12-Lead EKG discussed with EDP Hong aflutter 2:1 vs rapid ST, favoring flutter [CG]    Clinical Course User Index [CG] Arlean Hopping   MDM Rules/Calculators/A&P                          60 year old female presents to the ED for chest tightness, palpitation sudden onset 9 PM lasting only a few seconds.  Asymptomatic on arrival although her heart rate is in the 150s.  Hemodynamically stable.  History of daily alcohol use  with recent heavy alcohol intake over the weekend.  No history of arrhythmias.  Has occasionally felt palpitations in the past.  EKG reviewed with [facility] on arrival, rhythm appears to be a flutter/fibrillation versus rapid ST although favoring flutter.  This may be related to alcohol intake.  Labs, imaging ordered, reviewed and interpreted by me.  Labs reveal- essentially unremarkable.  Electrolytes normal.  TSH 6.74.  We will add T4.  Patient reports she is compliant with Synthroid but missed 1 dose over the weekend.  Imaging reveals-chest x-ray is unremarkable.  Patient spontaneously converted to sinus rhythm in the ED while receiving IV fluids.  She had already received 30 mg of Cardizem on route by EMS.  CHA2DS2-VASc score is 2 given HTN, sex.  Patient discussed with EDP Wickline who has evaluated patient.  Will discharge with anticoagulant and ambulatory referral to A. fib clinic.  She has cardiology established.  We will hold off on rhythm/rate controlled medicines.  She is not a candidate for cardioversion given that she is asymptomatic with heart rate in the 150s and has had occasional palpitations in the past she is passed a 48-hour window.  Patient reevaluated and remains asymptomatic, hemodynamically stable. Appropriate for discharge.  Final Clinical Impression(s) / ED Diagnoses Final diagnoses:  Atrial fibrillation with RVR ([place])    Rx / DC  Orders ED Discharge Orders         Ordered    rivaroxaban (XARELTO) 20 MG TABS tablet  Daily with supper        09/28/20 0128    Amb referral to [facility]        09/27/20 [place], [place], [place] 09/28/20 [phone]  Ripley Fraise, MD 09/28/20 [phone]

## 2020-09-27 NOTE — ED Triage Notes (Signed)
Pt arrived to ED via EMS from home w/ c/o A-fib RVR. Pt was sitting eating dinner when she became nauseated, had bilateral lower abd pain and then diarrhea after eating cheeseburgers and chili. Pt reports she had central chest tightness and palpitations. Fire gave 324mg  ASA. EMS gave a total of 30mg  cardizem with some decrease in HR. EMS IV 20g L hand. Hx of thyroid disorder and anxiety. Pt's brother recently diagnosed w/ A-fib RVR. Pt denies any CP now, denies shob, dizziness. EMS VS: 130/70, HR 160-200.

## 2020-09-27 NOTE — ED Provider Notes (Signed)
Pt presented in afib, now back in sinus rhythm spontaneously She is awake/alert Will have pharmacy to speak to her about starting DOAC   EKG Interpretation  Date/Time:  Monday September 27 2020 23:30:03 EDT Ventricular Rate:  85 PR Interval:  124 QRS Duration: 117 QT Interval:  369 QTC Calculation: 439 R Axis:   74 Text Interpretation: Sinus rhythm Nonspecific intraventricular conduction delay Minimal ST depression, diffuse leads Confirmed by Zadie Rhine ([id number]) on 09/27/2020 11:35:12 PM      This patients CHA2DS2-VASc Score and unadjusted Ischemic Stroke Rate (% per year) is equal to 2.2 % stroke rate/year from a score of 2  Above score calculated as 1 point each if present [CHF, HTN, DM, Vascular=MI/PAD/Aortic Plaque, Age if 65-74, or Female] Above score calculated as 2 points each if present [Age > 75, or Stroke/TIA/TE]     Zadie Rhine, MD 09/27/20 2340

## 2020-09-28 ENCOUNTER — Telehealth: Payer: Self-pay | Admitting: Cardiology

## 2020-09-28 MED ORDER — RIVAROXABAN 20 MG PO TABS
20.0000 mg | ORAL_TABLET | Freq: Every day | ORAL | Status: DC
  Administered 2020-09-28: 20 mg via ORAL
  Filled 2020-09-28: qty 1

## 2020-09-28 MED ORDER — RIVAROXABAN 20 MG PO TABS: 20.0000 mg | ORAL_TABLET | Freq: Every day | ORAL | 0 refills | Status: DC

## 2020-09-28 MED ORDER — RIVAROXABAN (XARELTO) EDUCATION KIT FOR AFIB PATIENTS
PACK | Freq: Once | Status: AC
  Filled 2020-09-28 (×2): qty 1

## 2020-09-28 NOTE — Telephone Encounter (Signed)
Left detailed message with MD notes as below on name-verified

## 2020-09-28 NOTE — Telephone Encounter (Signed)
It looks like she went back into sinus rhythm prior to leaving the ER.  Sounds like she should continue the meds as listed and we will see her in follow-up.

## 2020-09-28 NOTE — Discharge Instructions (Addendum)
  You were seen in the ER for palpitations, chest tightness  You were found to have irregular rhythm   Your TSH level is low, please follow up with your doctor about your thyroid levels  You will start anticoagulant (Xarelto), take this as instructed  An referral to atrial fibrillation was placed, please follow up  Avoid alcohol, caffeine. Stay hydrated  Return to ER for return of palpitations, light headedness, passing out, chest pain, SOB, bloody cough, leg swelling

## 2020-09-28 NOTE — Telephone Encounter (Signed)
Spoke with patient of Dr. Antoine Poche. She was in [place] with RVR yesterday and went to ED. HR 150s-160s. She was started on Xarelto. Explained purpose of this medication, advised to limit ETOH and caffeine intake. Notified patient will send message to MD and if he has different suggestion of meds, etc than what was previously advised at ED, we would let her know. She was advised to keep May appointment with MD

## 2020-09-28 NOTE — Telephone Encounter (Signed)
Pt c/o medication issue:  1. Name of Medication: Blood Thinner   rivaroxaban (XARELTO) 20 MG TABS tablet [[id number]]   2. How are you currently taking this medication (dosage and times per day)?  Take 1 tablet (20 mg total) by mouth daily with supper.  3. Are you having a reaction (difficulty breathing--STAT)? Na   4. What is your medication issue? Pt called in and stated she went to Ed last night and they put her on  This med. She wants to run this by Dr Antoine Poche to make sure this is something she should be on.    Best number [phone]

## 2020-10-14 DIAGNOSIS — R072 Precordial pain: Secondary | ICD-10-CM | POA: Insufficient documentation

## 2020-10-14 NOTE — Progress Notes (Signed)
Cardiology Office Note   Date:  10/15/2020   ID:  Cindy Williamson, DOB 1960-09-13, MRN [id number]  PCP:  Patient, No Pcp Per (Inactive)  Cardiologist:   No ref. provider found. Referring:   Chief Complaint  Patient presents with  . Palpitations      History of Present Illness: Cindy Williamson is a 60 y.o. female who follows up for evaluation of PAF.  She had this in late April and it was able to review the ER records for this.  She developed this today after she had been to a concert.  He had used some increased alcohol and she was not hydrated.  She hydrated with caffeinated beverages the next day.  She developed rapid heart rate suddenly when she got to the bathroom.  This was about 9 PM.  It probably lasted till about 2 AM or broke spontaneously in the emergency room.  She did call EMS and they gave her 30 mg of Cardizem and aspirin.  She had some mild heaviness with this but there was no evidence of ischemia.  She has never had this before or since.  She was given a prescription for Xarelto but has not started this.  She is otherwise felt well. The patient denies any new symptoms such as chest discomfort, neck or arm discomfort. There has been no new shortness of breath, PND or orthopnea. There have been no reported palpitations, presyncope or syncope.   Of note when I review the EKG it looks like 2-1 flutter.  Follow-up EKG demonstrated sinus rhythm without acute ST changes.  Past Medical History:  Diagnosis Date  . Acute cystitis   . Hypertension   . Menopausal syndrome   . Thyroid disease     Past Surgical History:  Procedure Laterality Date  . ABDOMINAL HYSTERECTOMY    . AUGMENTATION MAMMAPLASTY Bilateral 2004  . BREAST SURGERY    . ENDOMETRIAL ABLATION    . TUBAL LIGATION       Current Outpatient Medications  Medication Sig Dispense Refill  . aspirin 81 MG chewable tablet Chew 81 mg by mouth daily. 1 Daily    . estradiol (ESTRACE) 0.5 MG tablet estradiol 0.5 mg  tablet  TAKE 1 TABLET BY MOUTH EVERY DAY    . Ibuprofen (ADVIL PO) Take by mouth as needed.    Marland Kitchen levothyroxine (SYNTHROID) 100 MCG tablet Take 100 mcg by mouth at bedtime.    Marland Kitchen losartan (COZAAR) 50 MG tablet Take 50 mg by mouth daily.    . medroxyPROGESTERone (PROVERA) 2.5 MG tablet medroxyprogesterone 2.5 mg tablet  TAKE 1 TABLET BY MOUTH EVERY DAY    . PARoxetine (PAXIL) 10 MG tablet Take 10 mg by mouth daily.    Marland Kitchen VITAMIN D PO Take by mouth.     No current facility-administered medications for this visit.    Allergies:   Penicillins and Other    ROS:  Please see the history of present illness.   Otherwise, review of systems are positive for none.   All other systems are reviewed and negative.    PHYSICAL EXAM: VS:  BP 126/74   Pulse 65   Ht 5\' 5"  (1.651 m)   Wt 191 lb (86.6 kg)   SpO2 99%   BMI 31.78 kg/m  , BMI Body mass index is 31.78 kg/m. GENERAL:  Well appearing NECK:  No jugular venous distention, waveform within normal limits, carotid upstroke brisk and symmetric, no bruits, no thyromegaly LUNGS:  Clear to auscultation  bilaterally CHEST:  Unremarkable HEART:  PMI not displaced or sustained,S1 and S2 within normal limits, no S3, no S4, no clicks, no rubs, no murmurs ABD:  Flat, positive bowel sounds normal in frequency in pitch, no bruits, no rebound, no guarding, no midline pulsatile mass, no hepatomegaly, no splenomegaly EXT:  2 plus pulses throughout, no edema, no cyanosis no clubbing     EKG:  EKG is not ordered today. The ekg ordered 4/25/22demonstrates flutter 2:10, rate 152 , axis within normal limits, intervals within normal limits, no acute ST-T wave changes.   Recent Labs: 09/27/2020: BUN 15; Creatinine, Ser 0.83; Hemoglobin 14.4; Magnesium 1.9; Platelets 352; Potassium 3.7; Sodium 139; TSH 6.738    Lipid Panel No results found for: CHOL, TRIG, HDL, CHOLHDL, VLDL, LDLCALC, LDLDIRECT    Wt Readings from Last 3 Encounters:  10/15/20 191 lb (86.6 kg)   09/27/20 195 lb (88.5 kg)  01/23/20 195 lb 3.2 oz (88.5 kg)      Other studies Reviewed: Additional studies/ records that were reviewed today include:  ED records Review of the above records demonstrates:  Please see elsewhere in the note.     ASSESSMENT AND PLAN:  HTN:   Her blood pressure is controlled.no change in therapy.   CHEST PAIN:   Her coronary calcium score was zero.  She has no further chest pain.  No further work-up.tion.  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  I do not think she has an indication for anticoagulation given this 1 provoked episode and she has a low thromboembolic risk.  We talked about what to do should this happen again.  She probably would be an ablation candidate if it is recurrent frequent and particularly unprovoked.    PULMONARY NODULES: These were noted when she had a coronary calcium score.  She is on the books for a noncontrasted chest CT in August.   Current medicines are reviewed at length with the patient today.  The patient does not have concerns regarding medicines.  The following changes have been made:  None  Labs/ tests ordered today include:   No orders of the defined types were placed in this encounter.    Disposition:   FU with me as needed.     Signed, Rollene Rotunda, MD  10/15/2020 9:34 AM    [facility]

## 2020-10-15 ENCOUNTER — Ambulatory Visit: Payer: 59 | Admitting: Cardiology

## 2020-10-15 ENCOUNTER — Other Ambulatory Visit: Payer: Self-pay

## 2020-10-15 ENCOUNTER — Encounter: Payer: Self-pay | Admitting: Cardiology

## 2020-10-15 VITALS — BP 126/74 | HR 65 | Ht 65.0 in | Wt 191.0 lb

## 2020-10-15 DIAGNOSIS — I10 Essential (primary) hypertension: Secondary | ICD-10-CM | POA: Diagnosis not present

## 2020-10-15 DIAGNOSIS — R072 Precordial pain: Secondary | ICD-10-CM | POA: Diagnosis not present

## 2020-10-15 NOTE — Patient Instructions (Signed)
  Testing/Procedures:  CT wo contrast @ [facility]-SCHEDULE IN AUGUST 2022   Follow-Up: At [facility], you and your health needs are our priority.  As part of our continuing mission to provide you with exceptional heart care, we have created designated Provider Care Teams.  These Care Teams include your primary Cardiologist (physician) and Advanced Practice Providers (APPs -  Physician Assistants and Nurse Practitioners) who all work together to provide you with the care you need, when you need it.  We recommend signing up for the patient portal called "MyChart".  Sign up information is provided on this After Visit Summary.  MyChart is used to connect with patients for Virtual Visits (Telemedicine).  Patients are able to view lab/test results, encounter notes, upcoming appointments, etc.  Non-urgent messages can be sent to your provider as well.   To learn more about what you can do with MyChart, go to [email or web address].    Your next appointment:   12 month(s)  The format for your next appointment:   In Person  Provider:   Rollene Rotunda, MD

## 2021-01-04 ENCOUNTER — Ambulatory Visit
Admission: RE | Admit: 2021-01-04 | Discharge: 2021-01-04 | Disposition: A | Discharge status: Home / Self Care | Payer: 59 | Source: Ambulatory Visit | Attending: Cardiology | Admitting: Cardiology

## 2021-01-04 DIAGNOSIS — R918 Other nonspecific abnormal finding of lung field: Secondary | ICD-10-CM

## 2021-01-05 ENCOUNTER — Other Ambulatory Visit: Payer: Self-pay

## 2021-01-05 DIAGNOSIS — R9389 Abnormal findings on diagnostic imaging of other specified body structures: Secondary | ICD-10-CM

## 2021-01-05 DIAGNOSIS — R911 Solitary pulmonary nodule: Secondary | ICD-10-CM

## 2021-01-05 DIAGNOSIS — R918 Other nonspecific abnormal finding of lung field: Secondary | ICD-10-CM

## 2021-01-17 ENCOUNTER — Other Ambulatory Visit: Payer: Self-pay

## 2021-01-17 ENCOUNTER — Ambulatory Visit: Payer: 59 | Admitting: Emergency Medicine

## 2021-01-17 ENCOUNTER — Encounter: Payer: Self-pay | Admitting: Emergency Medicine

## 2021-01-17 VITALS — BP 118/82 | HR 79 | Temp 98.4°F | Ht 65.0 in | Wt 195.0 lb

## 2021-01-17 DIAGNOSIS — J479 Bronchiectasis, uncomplicated: Secondary | ICD-10-CM | POA: Insufficient documentation

## 2021-01-17 DIAGNOSIS — R911 Solitary pulmonary nodule: Secondary | ICD-10-CM

## 2021-01-17 DIAGNOSIS — R918 Other nonspecific abnormal finding of lung field: Secondary | ICD-10-CM | POA: Diagnosis not present

## 2021-01-17 NOTE — Patient Instructions (Signed)
We will plan to repeat your CT scan of the chest in 1 year. Please call if you develop any changes in your breathing, coughing, mucus production.  If so we may decide to repeat your imaging sooner. You can consider restarting your Nexium once daily to see if this helps with reflux and thereby improves your dry cough. Follow with Dr. Delton Coombes in 12 months or sooner if you have any problems.

## 2021-01-17 NOTE — Assessment & Plan Note (Signed)
Asymptomatic micronodular disease, inconsistent with malignancy given the waxing and waning nature although she does have a history of tobacco, family history of lung cancer.  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  In the absence of symptoms I believe we can follow serial films to ensure stability.  If she develops new symptoms I will check a CT chest sooner.  If we need to evaluate further then bronchoscopy for culture data, possibly cytology would be appropriate.  We will plan to repeat your CT scan of the chest in 1 year. Please call if you develop any changes in your breathing, coughing, mucus production.  If so we may decide to repeat your imaging sooner. Follow with Dr. Delton Coombes in 12 months or sooner if you have any problems.

## 2021-01-17 NOTE — Progress Notes (Signed)
Subjective:    Patient ID: Cindy Williamson, female    DOB: 09/16/1960, 60 y.o.   MRN: [id number]  HPI 60 year old woman, former smoker (~22 pack years, quit 4 yrs ago) with a history of hypertension, hypothyroidism, atrial fibrillation/flutter.  He has exertional SOB when walking 2 miles, has to stop to rest. Able to climb stairs. Has some cough at night, can wake her from sleep. Has GERD, uses nexium / tagamet   She underwent coronary calcium CT chest 01/26/2020 which I have reviewed, showed a calcium score of 0, a few small scattered 1-3 mm pulmonary nodules.  Repeat CT chest 01/04/2021 reviewed by me shows some scattered areas of clustered centrilobular tree-in-bud nodularity.  Some of these areas are improved compared with 01/2020 but some more easily seen for example in the peripheral right upper lobe, anterior right upper lobe, lingula.  Suspicious for [facility].   Review of Systems As per HPI   Past Medical History:  Diagnosis Date   Acute cystitis    Hypertension    Menopausal syndrome    Thyroid disease      Family History  Problem Relation Age of Onset   COPD Father    Atrial fibrillation Brother     Brother >> RA Father >> lung CA  Social History   Socioeconomic History   Marital status: Married    Spouse name: Not on file   Number of children: Not on file   Years of education: Not on file   Highest education level: Not on file  Occupational History   Not on file  Tobacco Use   Smoking status: Former    Years: 5.00    Types: Cigarettes    Quit date: 04/22/2017    Years since quitting: 3.7   Smokeless tobacco: Never  Substance and Sexual Activity   Alcohol use: Not on file    Comment: occ   Drug use: No   Sexual activity: Not on file  Other Topics Concern   Not on file  Social History Narrative   Not on file   Social Determinants of Health   Financial Resource Strain: Not on file  Food Insecurity: Not on file  Transportation Needs: Not on file  Physical  Activity: Not on file  Stress: Not on file  Social Connections: Not on file  Intimate Partner Violence: Not on file    From [place],  Works in Photographer No inhaled exposures No mold.  Never birds.    Allergies  Allergen Reactions   Penicillins Anaphylaxis   Other      Outpatient Medications Prior to Visit  Medication Sig Dispense Refill   estradiol (ESTRACE) 0.5 MG tablet estradiol 0.5 mg tablet  TAKE 1 TABLET BY MOUTH EVERY DAY     Ibuprofen (ADVIL PO) Take by mouth as needed.     levothyroxine (SYNTHROID) 100 MCG tablet Take 100 mcg by mouth at bedtime.     losartan (COZAAR) 50 MG tablet Take 50 mg by mouth daily.     medroxyPROGESTERone (PROVERA) 2.5 MG tablet medroxyprogesterone 2.5 mg tablet  TAKE 1 TABLET BY MOUTH EVERY DAY     PARoxetine (PAXIL) 10 MG tablet Take 10 mg by mouth daily.     VITAMIN D PO Take by mouth.     aspirin 81 MG chewable tablet Chew 81 mg by mouth daily. 1 Daily (Patient not taking: Reported on 01/17/2021)     No facility-administered medications prior to visit.        Objective:  Physical Exam  Vitals:   01/17/21 1501  BP: 118/82  Pulse: 79  Temp: 98.4 F (36.9 C)  TempSrc: Oral  SpO2: 98%  Weight: 195 lb (88.5 kg)  Height: 5\' 5"  (1.651 m)   Gen: Pleasant, well-nourished, in no distress,  normal affect  ENT: No lesions,  mouth clear,  oropharynx clear, no postnasal drip  Neck: No JVD, no stridor  Lungs: No use of accessory muscles, no crackles or wheezing on normal respiration, no wheeze on forced expiration  Cardiovascular: RRR, heart sounds normal, no murmur or gallops, no peripheral edema  Musculoskeletal: No deformities, no cyanosis or clubbing  Neuro: alert, awake, non focal  Skin: Warm, no lesions or rash     Assessment & Plan:  Pulmonary nodules Asymptomatic micronodular disease, inconsistent with malignancy given the waxing and waning nature although she does have a history of tobacco, family history of lung cancer.   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  In the absence of symptoms I believe we can follow serial films to ensure stability.  If she develops new symptoms I will check a CT chest sooner.  If we need to evaluate further then bronchoscopy for culture data, possibly cytology would be appropriate.  We will plan to repeat your CT scan of the chest in 1 year. Please call if you develop any changes in your breathing, coughing, mucus production.  If so we may decide to repeat your imaging sooner. Follow with Dr. in 12 months or sooner if you have any problems.   Delton Coombes, MD, PhD 01/17/2021, 5:39 PM [facility] [phone] or if no answer before 7:00PM call [phone] For any issues after 7:00PM please call eLink [phone]

## 2021-05-13 ENCOUNTER — Other Ambulatory Visit: Payer: Self-pay | Admitting: Obstetrics & Gynecology

## 2021-05-13 ENCOUNTER — Other Ambulatory Visit (HOSPITAL_COMMUNITY): Payer: Self-pay | Admitting: Obstetrics & Gynecology

## 2021-05-13 DIAGNOSIS — R197 Diarrhea, unspecified: Secondary | ICD-10-CM

## 2021-05-13 DIAGNOSIS — R1011 Right upper quadrant pain: Secondary | ICD-10-CM

## 2021-05-19 ENCOUNTER — Ambulatory Visit (HOSPITAL_COMMUNITY)
Admission: RE | Admit: 2021-05-19 | Discharge: 2021-05-19 | Disposition: A | Discharge status: Home / Self Care | Payer: 59 | Source: Ambulatory Visit | Attending: Obstetrics & Gynecology | Admitting: Obstetrics & Gynecology

## 2021-05-19 ENCOUNTER — Other Ambulatory Visit: Payer: Self-pay

## 2021-05-19 DIAGNOSIS — R1011 Right upper quadrant pain: Secondary | ICD-10-CM | POA: Insufficient documentation

## 2021-05-19 DIAGNOSIS — R197 Diarrhea, unspecified: Secondary | ICD-10-CM | POA: Diagnosis not present

## 2021-09-02 ENCOUNTER — Other Ambulatory Visit: Payer: Self-pay | Admitting: Obstetrics & Gynecology

## 2021-09-02 DIAGNOSIS — Z1231 Encounter for screening mammogram for malignant neoplasm of breast: Secondary | ICD-10-CM

## 2021-09-12 ENCOUNTER — Ambulatory Visit
Admission: RE | Admit: 2021-09-12 | Discharge: 2021-09-12 | Disposition: A | Discharge status: Home / Self Care | Payer: 59 | Source: Ambulatory Visit | Attending: Obstetrics & Gynecology | Admitting: Obstetrics & Gynecology

## 2021-09-12 DIAGNOSIS — Z1231 Encounter for screening mammogram for malignant neoplasm of breast: Secondary | ICD-10-CM

## 2021-12-11 DIAGNOSIS — I4892 Unspecified atrial flutter: Secondary | ICD-10-CM | POA: Insufficient documentation

## 2021-12-11 NOTE — Progress Notes (Unsigned)
Cardiology Office Note   Date:  12/11/2021   ID:  Cindy Williamson, DOB July 13, 1960, MRN [id number]  PCP:  Annamaria Helling, MD  Cardiologist:   Annamaria Helling, MD. Referring:   No chief complaint on file.     History of Present Illness: Cindy Williamson is a 61 y.o. female who follows up for evaluation of PAF.  ***    *** She had this in late April and it was able to review the ER records for this.  She developed this today after she had been to a concert.  He had used some increased alcohol and she was not hydrated.  She hydrated with caffeinated beverages the next day.  She developed rapid heart rate suddenly when she got to the bathroom.  This was about 9 PM.  It probably lasted till about 2 AM or broke spontaneously in the emergency room.  She did call EMS and they gave her 30 mg of Cardizem and aspirin.  She had some mild heaviness with this but there was no evidence of ischemia.  She has never had this before or since.  She was given a prescription for Xarelto but has not started this.  She is otherwise felt well. The patient denies any new symptoms such as chest discomfort, neck or arm discomfort. There has been no new shortness of breath, PND or orthopnea. There have been no reported palpitations, presyncope or syncope.   Of note when I review the EKG it looks like 2-1 flutter.  Follow-up EKG demonstrated sinus rhythm without acute ST changes.  Past Medical History:  Diagnosis Date   Acute cystitis    Hypertension    Menopausal syndrome    Thyroid disease     Past Surgical History:  Procedure Laterality Date   ABDOMINAL HYSTERECTOMY     AUGMENTATION MAMMAPLASTY Bilateral 2004   BREAST SURGERY     ENDOMETRIAL ABLATION     TUBAL LIGATION       Current Outpatient Medications  Medication Sig Dispense Refill   aspirin 81 MG chewable tablet Chew 81 mg by mouth daily. 1 Daily (Patient not taking: Reported on 01/17/2021)     estradiol (ESTRACE) 0.5 MG tablet estradiol 0.5 mg tablet   TAKE 1 TABLET BY MOUTH EVERY DAY     Ibuprofen (ADVIL PO) Take by mouth as needed.     levothyroxine (SYNTHROID) 100 MCG tablet Take 100 mcg by mouth at bedtime.     losartan (COZAAR) 50 MG tablet Take 50 mg by mouth daily.     medroxyPROGESTERone (PROVERA) 2.5 MG tablet medroxyprogesterone 2.5 mg tablet  TAKE 1 TABLET BY MOUTH EVERY DAY     PARoxetine (PAXIL) 10 MG tablet Take 10 mg by mouth daily.     VITAMIN D PO Take by mouth.     No current facility-administered medications for this visit.    Allergies:   Penicillins and Other    ROS:  Please see the history of present illness.   Otherwise, review of systems are positive for ***.   All other systems are reviewed and negative.    PHYSICAL EXAM: VS:  There were no vitals taken for this visit. , BMI There is no height or weight on file to calculate BMI. GENERAL:  Well appearing NECK:  No jugular venous distention, waveform within normal limits, carotid upstroke brisk and symmetric, no bruits, no thyromegaly LUNGS:  Clear to auscultation bilaterally CHEST:  Unremarkable HEART:  PMI not displaced or sustained,S1 and S2 within  normal limits, no S3, no S4, no clicks, no rubs, *** murmurs ABD:  Flat, positive bowel sounds normal in frequency in pitch, no bruits, no rebound, no guarding, no midline pulsatile mass, no hepatomegaly, no splenomegaly EXT:  2 plus pulses throughout, no edema, no cyanosis no clubbing     ***GENERAL:  Well appearing NECK:  No jugular venous distention, waveform within normal limits, carotid upstroke brisk and symmetric, no bruits, no thyromegaly LUNGS:  Clear to auscultation bilaterally CHEST:  Unremarkable HEART:  PMI not displaced or sustained,S1 and S2 within normal limits, no S3, no S4, no clicks, no rubs, no murmurs ABD:  Flat, positive bowel sounds normal in frequency in pitch, no bruits, no rebound, no guarding, no midline pulsatile mass, no hepatomegaly, no splenomegaly EXT:  2 plus pulses  throughout, no edema, no cyanosis no clubbing   EKG:  EKG is *** ordered today. The ekg ordered demonstrates ***    Recent Labs: No results found for requested labs within last 365 days.    Lipid Panel No results found for: "CHOL", "TRIG", "HDL", "CHOLHDL", "VLDL", "LDLCALC", "LDLDIRECT"    Wt Readings from Last 3 Encounters:  01/17/21 195 lb (88.5 kg)  10/15/20 191 lb (86.6 kg)  09/27/20 195 lb (88.5 kg)      Other studies Reviewed: Additional studies/ records that were reviewed today include: *** Review of the above records demonstrates:  Please see elsewhere in the note.     ASSESSMENT AND PLAN:  HTN:   Her blood pressure is *** controlled.no change in therapy.   ATRIAL FLUTTER  ***  : I think this was precipitated by being dehydrated significant alcohol use that weekend.  I do not think she has an indication for anticoagulation given this 1 provoked episode and she has a low thromboembolic risk.  We talked about what to do should this happen again.  She probably would be an ablation candidate if it is recurrent frequent and particularly unprovoked.    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  She was seen by Dr. Delton Coombes and the plan is for follow up CT this summer.  *** These were noted when she had a coronary calcium score.  She is on the books for a noncontrasted chest CT in August.   Current medicines are reviewed at length with the patient today.  The patient does not have concerns regarding medicines.  The following changes have been made:  None  Labs/ tests ordered today include:   No orders of the defined types were placed in this encounter.    Disposition:   FU with me as needed.     Signed, Rollene Rotunda, MD  12/11/2021 5:47 PM    [facility]

## 2021-12-12 IMAGING — MG MM  DIGITAL DIAGNOSTIC BREAST BILAT IMPLANT W/ TOMO W/ CAD
8 of 13 series · 8 of 33 positions shown · non-contrast
Comparison: Previous exam(s).

CLINICAL DATA: Patient presents for evaluation of palpable
abnormality within the right breast and diffuse left lateral breast
tenderness.

EXAM:
DIGITAL DIAGNOSTIC BILATERAL MAMMOGRAM WITH IMPLANTS, CAD AND TOMO
ULTRASOUND BILATERAL BREAST
The patient has retropectoral implants. Standard and implant
displaced views were performed.

[L CC]
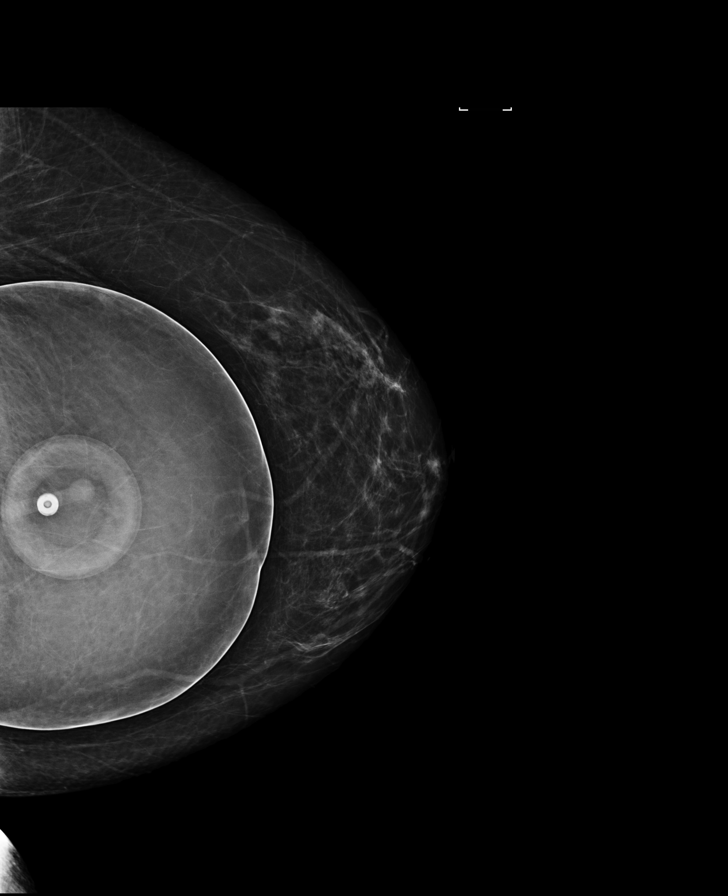

[R MLO]
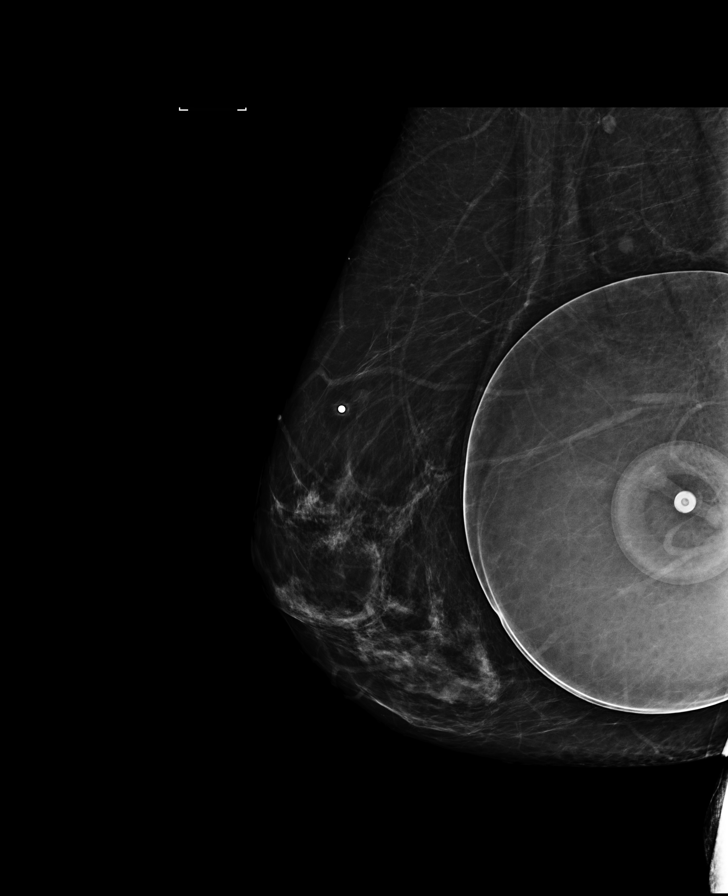

[L MLO]
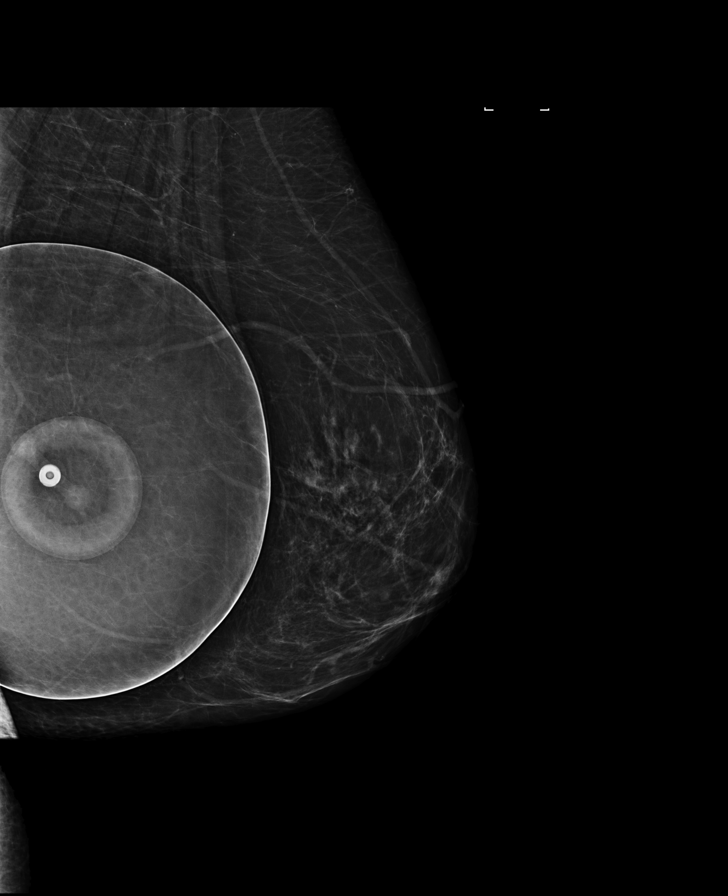

[R CC]
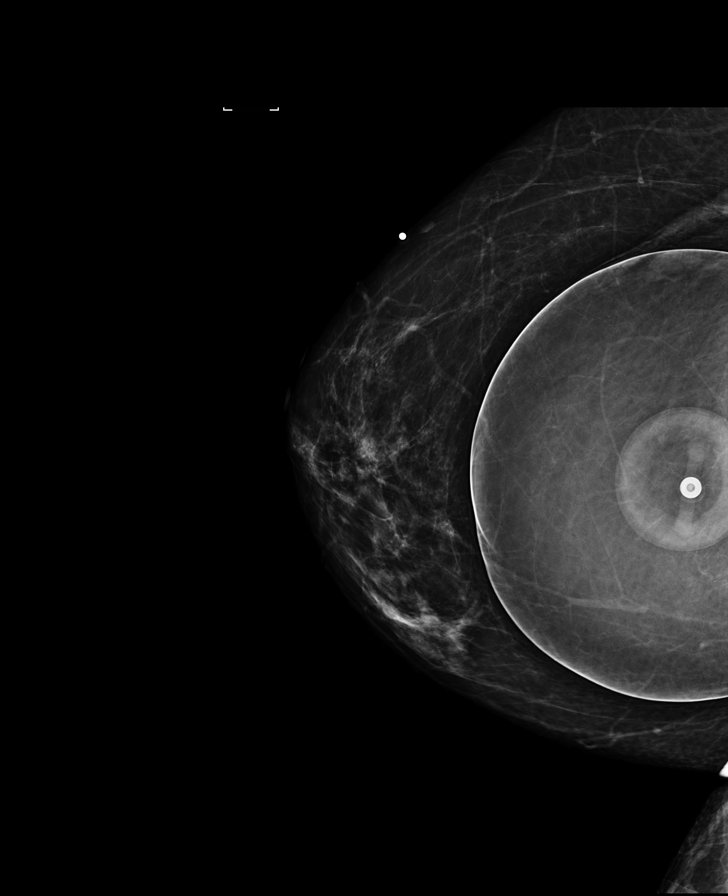

[R TAN synth-2D]
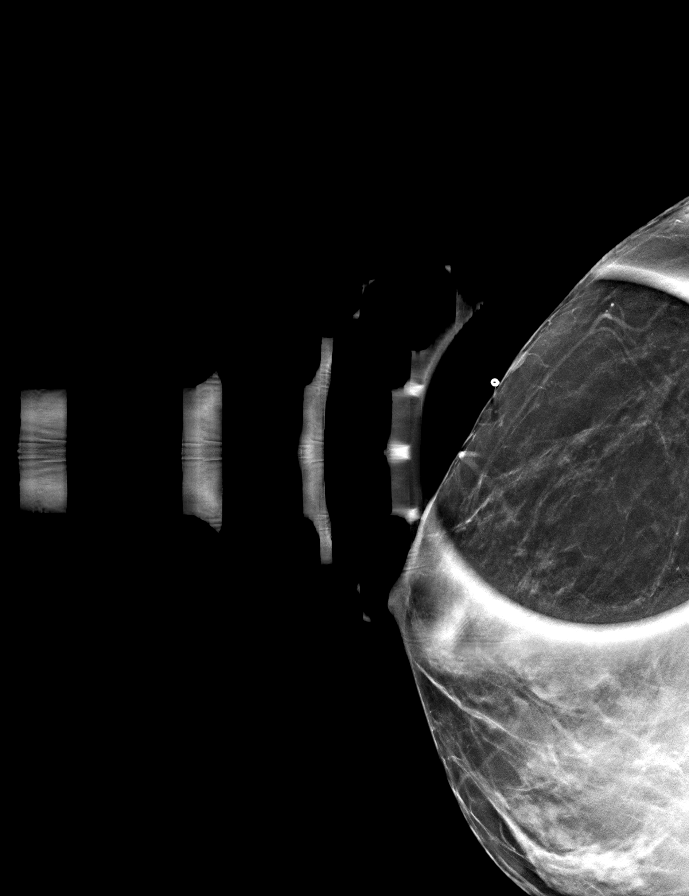

[L CC synth-2D]
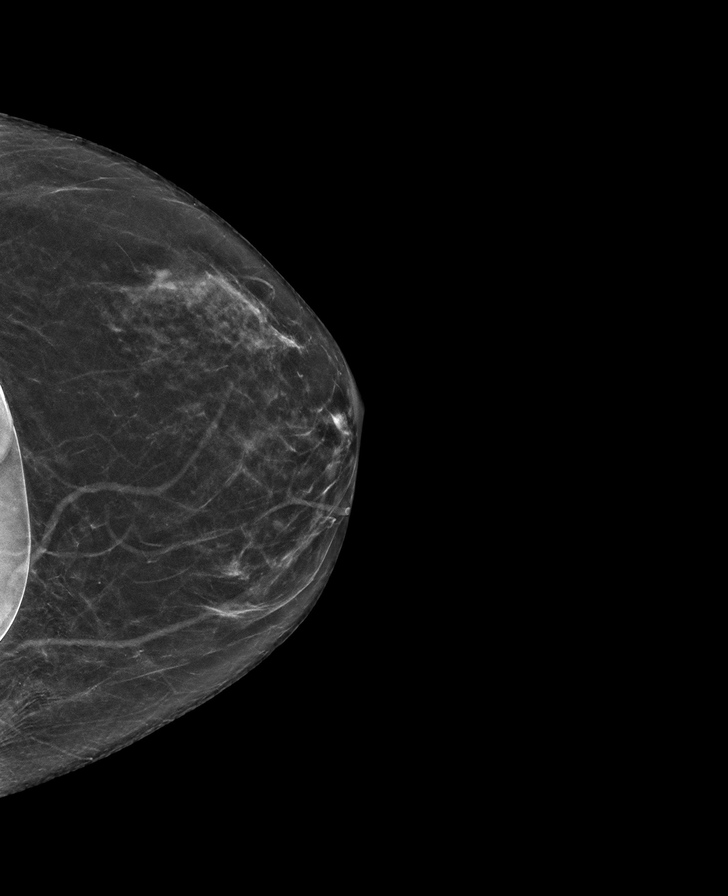

[L MLO synth-2D]
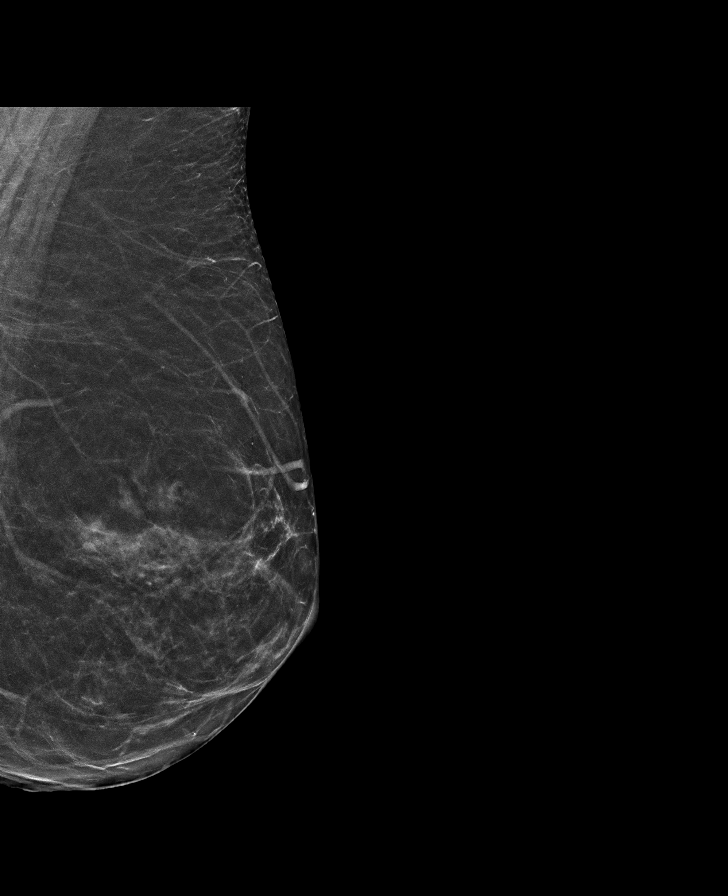

[R MLO synth-2D]
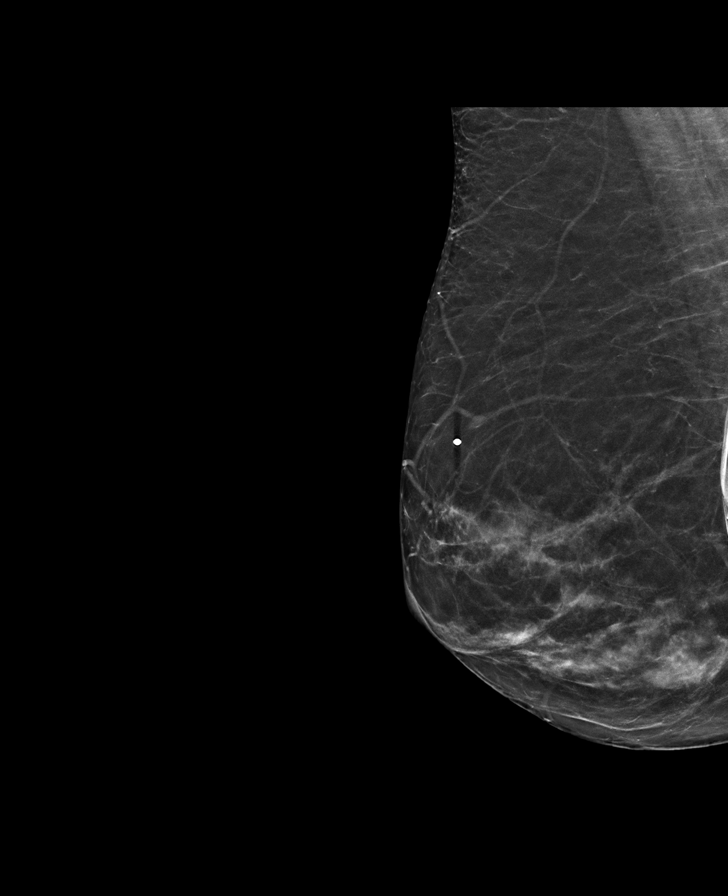

[8 of 33 positions shown; findings below may reference images not displayed]

ACR Breast Density Category c: The breast tissue is heterogeneously
dense, which may obscure small masses.
FINDINGS: Underlying the palpable marker within the right breast at the site
of palpable concern is a small oval circumscribed mass. No
additional masses, calcifications or distortion identified within
either breast.

Targeted ultrasound is performed, showing a 4 x 2 x 3 mm oval
circumscribed hypoechoic mass compatible with a mildly complicated
cyst right breast 10 o'clock position 3 cm from nipple at the site
of palpable concern.

No suspicious abnormality identified within the left breast
laterally at the site of focal tenderness.

Mammographic images were processed with CAD.
IMPRESSION: No mammographic evidence for malignancy.

Mildly complicated right breast cyst.

RECOMMENDATION:
Continued clinical evaluation for left breast pain and right breast
palpable abnormality.

Screening mammogram in one year.(Code:[id number]-T-684)

I have discussed the findings and recommendations with the patient.
If applicable, a reminder letter will be sent to the patient
regarding the next appointment.

BI-RADS CATEGORY  2: Benign.

## 2021-12-13 ENCOUNTER — Ambulatory Visit: Payer: 59 | Admitting: Cardiology

## 2021-12-13 ENCOUNTER — Encounter: Payer: Self-pay | Admitting: Cardiology

## 2021-12-13 VITALS — BP 118/88 | HR 66 | Ht 65.0 in | Wt 190.0 lb

## 2021-12-13 DIAGNOSIS — I10 Essential (primary) hypertension: Secondary | ICD-10-CM

## 2021-12-13 DIAGNOSIS — I4892 Unspecified atrial flutter: Secondary | ICD-10-CM

## 2021-12-13 DIAGNOSIS — K219 Gastro-esophageal reflux disease without esophagitis: Secondary | ICD-10-CM | POA: Diagnosis not present

## 2021-12-13 NOTE — Patient Instructions (Signed)
  Follow-Up: At [facility], you and your health needs are our priority.  As part of our continuing mission to provide you with exceptional heart care, we have created designated Provider Care Teams.  These Care Teams include your primary Cardiologist (physician) and Advanced Practice Providers (APPs -  Physician Assistants and Nurse Practitioners) who all work together to provide you with the care you need, when you need it.  We recommend signing up for the patient portal called "MyChart".  Sign up information is provided on this After Visit Summary.  MyChart is used to connect with patients for Virtual Visits (Telemedicine).  Patients are able to view lab/test results, encounter notes, upcoming appointments, etc.  Non-urgent messages can be sent to your provider as well.   To learn more about what you can do with MyChart, go to [email or web address].    Your next appointment:  as needed1}    Other Instructions  [employer]  Important Information About Sugar

## 2022-01-03 ENCOUNTER — Ambulatory Visit
Admission: RE | Admit: 2022-01-03 | Discharge: 2022-01-03 | Disposition: A | Discharge status: Home / Self Care | Payer: 59 | Source: Ambulatory Visit | Attending: Emergency Medicine | Admitting: Emergency Medicine

## 2022-01-03 DIAGNOSIS — R911 Solitary pulmonary nodule: Secondary | ICD-10-CM

## 2022-01-30 ENCOUNTER — Telehealth: Payer: Self-pay | Admitting: Emergency Medicine

## 2022-02-01 NOTE — Telephone Encounter (Signed)
Leslye Peer, MD  01/27/2022  1:30 AM EDT     Please inform patient that CT chest shows that pulmonary inflammation and nodules are stable to improved. Good news. We can discuss the need or timing of repeat CT scan at next office visit.     Lm for patient.

## 2022-02-08 ENCOUNTER — Telehealth: Payer: Self-pay | Admitting: Emergency Medicine

## 2022-02-08 ENCOUNTER — Ambulatory Visit: Payer: 59 | Admitting: Nurse Practitioner

## 2022-02-08 ENCOUNTER — Ambulatory Visit (INDEPENDENT_AMBULATORY_CARE_PROVIDER_SITE_OTHER): Payer: 59

## 2022-02-08 ENCOUNTER — Telehealth: Payer: Self-pay | Admitting: Nurse Practitioner

## 2022-02-08 ENCOUNTER — Encounter: Payer: Self-pay | Admitting: Nurse Practitioner

## 2022-02-08 VITALS — BP 120/70 | HR 75 | Ht 65.0 in | Wt 187.6 lb

## 2022-02-08 DIAGNOSIS — J189 Pneumonia, unspecified organism: Secondary | ICD-10-CM

## 2022-02-08 DIAGNOSIS — R918 Other nonspecific abnormal finding of lung field: Secondary | ICD-10-CM | POA: Diagnosis not present

## 2022-02-08 DIAGNOSIS — J209 Acute bronchitis, unspecified: Secondary | ICD-10-CM | POA: Diagnosis not present

## 2022-02-08 DIAGNOSIS — J069 Acute upper respiratory infection, unspecified: Secondary | ICD-10-CM

## 2022-02-08 MED ORDER — ALBUTEROL SULFATE HFA 108 (90 BASE) MCG/ACT IN AERS: 2.0000 | INHALATION_SPRAY | Freq: Four times a day (QID) | RESPIRATORY_TRACT | 2 refills | Status: AC | PRN

## 2022-02-08 MED ORDER — LEVOFLOXACIN 500 MG PO TABS: 500.0000 mg | ORAL_TABLET | Freq: Every day | ORAL | 0 refills | Status: DC

## 2022-02-08 MED ORDER — PROMETHAZINE-DM 6.25-15 MG/5ML PO SYRP: 5.0000 mL | ORAL_SOLUTION | Freq: Every evening | ORAL | 0 refills | Status: AC | PRN

## 2022-02-08 MED ORDER — PREDNISONE 20 MG PO TABS: 40.0000 mg | ORAL_TABLET | Freq: Every day | ORAL | 0 refills | Status: AC

## 2022-02-08 MED ORDER — BENZONATATE 200 MG PO CAPS: 200.0000 mg | ORAL_CAPSULE | Freq: Three times a day (TID) | ORAL | 1 refills | Status: AC | PRN

## 2022-02-08 MED ORDER — FLUTICASONE PROPIONATE 50 MCG/ACT NA SUSP: 2.0000 | Freq: Every day | NASAL | 2 refills | Status: AC

## 2022-02-08 NOTE — Assessment & Plan Note (Signed)
Improved on recent CT chest from 01/2022. Follow up scheduled with Dr. Delton Coombes in October.

## 2022-02-08 NOTE — Telephone Encounter (Signed)
Called and spoke with pt who states she has had a cough now for about 2 weeks. States that she is not coughing up any mucus. States that she has had multiple covid tests, flu tests, and flu tests and all tests have come back negative.  Pt denies any complaints of wheezing. States she does have some SOB when she coughs.  Stated to pt that we could see her today at 11:30 and pt verbalized understanding. Appt scheduled for pt with KC. Nothing further needed.

## 2022-02-08 NOTE — Telephone Encounter (Signed)
Notified patient that her CXR today showed opacities throughout the RLL and RUL, consistent with pneumonia. She completed azithromycin Monday 9/4. She has an anaphylactic allergy to pcn. Recommended levaquin 7 day course. Close follow up in 1 week.

## 2022-02-08 NOTE — Progress Notes (Signed)
@Patient  ID: , female    DOB: 02/10/1961, 61 y.o.   MRN: [id number]  Chief Complaint  Patient presents with   Acute Visit    Cough for 3 weeks.     Referring provider: [id number], MD  HPI: 61 year old female, former smoker followed for pulmonary nodule and dyspnea. She is a patient of Dr. 46 and last seen in office on 01/17/2021. Past medical history significant for HTN, a flutter, obesity.  TEST/EVENTS:  01/03/2022 Super D CT chest: No LAD. Scattered regions of mild cylindrical BTX in both lungs with associated mild patchy tree-in-bud opacities and scattered small solid pulmonary nodules, most proiment in the RUL and RLL. Improved compared to previous. Solid 2 cm RUL nodule decreased from 0.6 cm. 0.3 cm RLL nodule stable. 0.4 cm LLL nodule stable.   02/08/2022: Today - acute Patient presents today for acute visit. She developed a cough around 2 weeks ago that has persisted. It is dry and causes her chest to hurt when she gets into a coughing spell. She was having some sore throat and phlegm in the back of her throat; thought she may have strep but this has improved. She does still have some nasal congestion and chest congestion. She gets short of breath with coughing spells; otherwise, breathing is at her baseline. She had fevers the end of last week, up to 103. She saw her primary care provider, who is her OBGYN, and sent in z pack for her which she completed Monday. She has not had fevers since Saturday and is not taking any antipyretics. She also was tested for COVID, flu and strep, which were all negative. She denies any hemoptysis, wheezing, anorexia, headaches, leg swelling. No known sick exposures. She has been taking OTC cough syrup, with minimal relief.   Allergies  Allergen Reactions   Penicillins Anaphylaxis   Other     Immunization History  Administered Date(s) Administered   PFIZER Comirnaty(Gray Top)Covid-19 Tri-Sucrose Vaccine 02/02/2020, 02/23/2020     Past Medical History:  Diagnosis Date   Acute cystitis    Hypertension    Menopausal syndrome    Thyroid disease     Tobacco History: Social History   Tobacco Use  Smoking Status Former   Years: 5.00   Types: Cigarettes   Quit date: 04/22/2017   Years since quitting: 4.8  Smokeless Tobacco Never   Counseling given: Not Answered   Outpatient Medications Prior to Visit  Medication Sig Dispense Refill   amoxicillin (AMOXIL) 500 MG tablet Take 500 mg by mouth 3 (three) times daily.     Cyanocobalamin (VITAMIN B-12) 5000 MCG TBDP Take by mouth.     estradiol (ESTRACE) 0.5 MG tablet estradiol 0.5 mg tablet  TAKE 1 TABLET BY MOUTH EVERY DAY     Ibuprofen (ADVIL PO) Take by mouth as needed.     levothyroxine (SYNTHROID) 100 MCG tablet Take 100 mcg by mouth at bedtime.     losartan (COZAAR) 50 MG tablet Take 50 mg by mouth daily.     medroxyPROGESTERone (PROVERA) 2.5 MG tablet medroxyprogesterone 2.5 mg tablet  TAKE 1 TABLET BY MOUTH EVERY DAY     PARoxetine (PAXIL) 10 MG tablet Take 10 mg by mouth daily.     VITAMIN D PO Take by mouth.     No facility-administered medications prior to visit.     Review of Systems:   Constitutional: No weight loss or gain, night sweats, chills. +fevers, fatigue, lassitude. HEENT: No headaches, difficulty  swallowing, tooth/dental problems. No sneezing, itching, ear ache. +nasal congestion, post nasal drip, sore throat, voice hoarseness CV:  No chest pain, orthopnea, PND, swelling in lower extremities, anasarca, dizziness, palpitations, syncope Resp: +shortness of breath with coughing spells; dry cough; chest tightness. No excess mucus or change in color of mucus. No wheezing. No hemoptysis. No chest wall deformity GI:  No heartburn, indigestion, abdominal pain, nausea, vomiting, diarrhea, change in bowel habits, loss of appetite, bloody stools.  GU: No dysuria, change in color of urine, urgency or frequency.  No flank pain, no hematuria   Skin: No rash, lesions, ulcerations MSK:  No joint pain or swelling.  No decreased range of motion.  No back pain. Neuro: No dizziness or lightheadedness.  Psych: No depression or anxiety. Mood stable.     Physical Exam:  BP 120/70 (BP Location: Right Arm)   Pulse 75   Ht 5\' 5"  (1.651 m)   Wt 187 lb 9.6 oz (85.1 kg)   SpO2 96%   BMI 31.22 kg/m   GEN: Pleasant, interactive, well-kempt; in no acute distress HEENT:  Normocephalic and atraumatic. PERRLA. Sclera white. Nasal turbinates erythematous, moist and patent bilaterally. Clear rhinorrhea present. Oropharynx erythematous and moist, without exudate or edema. No lesions, ulcerations NECK:  Supple w/ fair ROM. No JVD present. Normal carotid impulses w/o bruits. Thyroid symmetrical with no goiter or nodules palpated. No lymphadenopathy.   CV: RRR, no m/r/g, no peripheral edema. Pulses intact, +2 bilaterally. No cyanosis, pallor or clubbing. PULMONARY:  Unlabored, regular breathing. Minimal scattered rhonchi bilaterally A&P. Bronchitic cough. No accessory muscle use. No dullness to percussion. GI: BS present and normoactive. Soft, non-tender to palpation.  MSK: No erythema, warmth or tenderness.  Neuro: A/Ox3. No focal deficits noted.   Skin: Warm, no lesions or rashe Psych: Normal affect and behavior. Judgement and thought content appropriate.     Lab Results:  CBC    Component Value Date/Time   WBC 6.8 09/27/2020 2230   RBC 4.61 09/27/2020 2230   HGB 14.4 09/27/2020 2230   HCT 43.3 09/27/2020 2230   PLT 352 09/27/2020 2230   MCV 93.9 09/27/2020 2230   MCH 31.2 09/27/2020 2230   MCHC 33.3 09/27/2020 2230   RDW 12.9 09/27/2020 2230    BMET    Component Value Date/Time   NA 139 09/27/2020 2230   K 3.7 09/27/2020 2230   CL 105 09/27/2020 2230   CO2 21 (L) 09/27/2020 2230   GLUCOSE 104 (H) 09/27/2020 2230   BUN 15 09/27/2020 2230   CREATININE 0.83 09/27/2020 2230   CALCIUM 9.6 09/27/2020 2230   GFRNONAA >60  09/27/2020 2230    BNP No results found for: "BNP"   Imaging:  DG Chest 2 View  Result Date: 02/08/2022 CLINICAL DATA:  Acute bronchitis.  Nonproductive cough for 2 weeks. EXAM: CHEST - 2 VIEW COMPARISON:  Chest radiograph 09/27/2020 and chest CT 01/03/2022 FINDINGS: The cardiomediastinal silhouette is unchanged with normal heart size. There are new patchy airspace opacities laterally in the right lower lung, and there is also a small irregular opacity in the right upper lobe. The left lung is clear. No pleural effusion or pneumothorax is identified. No acute osseous abnormality is seen. IMPRESSION: Patchy right lung opacities compatible with pneumonia. Electronically Signed   By: Logan Bores M.D.   On: 02/08/2022 12:33          No data to display          No results found for: "  NITRICOXIDE"      Assessment & Plan:   Acute bronchitis Acute bronchitis related to URI. She did have fevers late last week so we will check a CXR to rule out superimposed infection. Mild bronchospasm on exam. Depo inj 80 mg x 1 and prednisone burst. Cough control measures and supportive care advised. Provided with PRN albuterol inhaler for shortness of breath/coughing spells.  Patient Instructions  -Albuterol inhaler 2 puffs every 6 hours as needed for shortness of breath or wheezing. Notify if symptoms persist despite rescue inhaler/neb use.  -Prednisone 40 mg daily for 5 days. Take in AM with food. Start tomorrow -Lawyer (benzonatate) 1 capsule 3 times daily as needed for cough -Phenergan DM cough syrup 5 mL nightly as needed for cough -Flonase nasal spray 2 sprays each nostril daily for nasal congestion/postnasal drip  Chest x ray today.   Follow up as scheduled 10/1 with Dr. Delton Coombes. If symptoms do not improve or worsen, please contact office for sooner follow up or seek emergency care.     Pulmonary nodules Improved on recent CT chest from 01/2022. Follow up scheduled with Dr. Delton Coombes  in October.   URI (upper respiratory infection) Symptom onset 2 weeks ago. COVID and flu were negative yesterday, 9/5. Supportive care advised.    I spent 35 minutes of dedicated to the care of this patient on the date of this encounter to include pre-visit review of records, face-to-face time with the patient discussing conditions above, post visit ordering of testing, clinical documentation with the electronic health record, making appropriate referrals as documented, and communicating necessary findings to members of the patients care team.  Noemi Chapel, NP 02/08/2022  Pt aware and understands NP's role.

## 2022-02-08 NOTE — Assessment & Plan Note (Signed)
Symptom onset 2 weeks ago. COVID and flu were negative yesterday, 9/5. Supportive care advised.

## 2022-02-08 NOTE — Assessment & Plan Note (Signed)
Acute bronchitis related to URI. She did have fevers late last week so we will check a CXR to rule out superimposed infection. Mild bronchospasm on exam. Depo inj 80 mg x 1 and prednisone burst. Cough control measures and supportive care advised. Provided with PRN albuterol inhaler for shortness of breath/coughing spells.  Patient Instructions  -Albuterol inhaler 2 puffs every 6 hours as needed for shortness of breath or wheezing. Notify if symptoms persist despite rescue inhaler/neb use.  -Prednisone 40 mg daily for 5 days. Take in AM with food. Start tomorrow -Lawyer (benzonatate) 1 capsule 3 times daily as needed for cough -Phenergan DM cough syrup 5 mL nightly as needed for cough -Flonase nasal spray 2 sprays each nostril daily for nasal congestion/postnasal drip  Chest x ray today.   Follow up as scheduled 10/1 with Dr. Delton Coombes. If symptoms do not improve or worsen, please contact office for sooner follow up or seek emergency care.

## 2022-02-08 NOTE — Patient Instructions (Addendum)
-  Albuterol inhaler 2 puffs every 6 hours as needed for shortness of breath or wheezing. Notify if symptoms persist despite rescue inhaler/neb use.  -Prednisone 40 mg daily for 5 days. Take in AM with food. Start tomorrow -Lawyer (benzonatate) 1 capsule 3 times daily as needed for cough -Phenergan DM cough syrup 5 mL nightly as needed for cough -Flonase nasal spray 2 sprays each nostril daily for nasal congestion/postnasal drip  Chest x ray today.   Follow up as scheduled 10/1 with Dr. Delton Coombes. If symptoms do not improve or worsen, please contact office for sooner follow up or seek emergency care.

## 2022-02-08 NOTE — Telephone Encounter (Signed)
Attempted to call pt but unable to reach. Left message for her to return call. 

## 2022-02-09 NOTE — Telephone Encounter (Signed)
I sent it to her MyChart yesterday!

## 2022-02-09 NOTE — Telephone Encounter (Signed)
Mychart message sent by pt: Cindy [facility] (supporting Noemi Chapel, NP) Yesterday (4:43 PM)    Please don't forget I will need a letter for being out of work this week please.     Routing to [employer].

## 2022-02-10 ENCOUNTER — Telehealth: Payer: Self-pay | Admitting: Nurse Practitioner

## 2022-02-10 NOTE — Telephone Encounter (Deleted)
Irving Burton can you please tell Florentina Addison that the antibiotics are making my lower legs shake like restless leg syndrome at night. She had said she if that happened she may need to change my medication. Thank you

## 2022-02-10 NOTE — Telephone Encounter (Signed)
Called and spoke with pt who states yesterday 9/7 was the second day on the levaquin. States that her legs felt like they were jumping all over the place almost like she had restless leg syndrome.  Katie, please advise on this.

## 2022-02-10 NOTE — Telephone Encounter (Signed)
Spoke with patient. She stated that symptoms are only occurring at night and happened once last night. I do not think this is related to the levaquin given it only has occurred at night and she has not had any more events today. Encouraged her to push fluids to ensure she is staying hydrated. Continue levaquin and monitor symptoms. If this worsens and starts occurring more frequently, she develops muscle pain/weakness, tendon pain, dizziness, or worsening symptoms to go to stop and go to the ED over the weekend. Nothing further needed. Thanks!

## 2022-02-14 ENCOUNTER — Telehealth: Payer: Self-pay | Admitting: Nurse Practitioner

## 2022-02-14 NOTE — Telephone Encounter (Signed)
Patient stated she is returning a call she got today.  She said she did not have a name or a message.  She would like a call back.  CB# [phone]

## 2022-02-15 ENCOUNTER — Ambulatory Visit: Payer: 59 | Admitting: Nurse Practitioner

## 2022-02-15 ENCOUNTER — Encounter: Payer: Self-pay | Admitting: Nurse Practitioner

## 2022-02-15 DIAGNOSIS — J189 Pneumonia, unspecified organism: Secondary | ICD-10-CM

## 2022-02-15 DIAGNOSIS — J069 Acute upper respiratory infection, unspecified: Secondary | ICD-10-CM | POA: Diagnosis not present

## 2022-02-15 NOTE — Patient Instructions (Addendum)
-  Continue Albuterol inhaler 2 puffs every 6 hours as needed for shortness of breath or wheezing. Notify if symptoms persist despite rescue inhaler/neb use.  -Continue Flonase nasal spray 2 sprays each nostril daily for nasal congestion/postnasal drip -Continue tessalon perles (benzonatate) 1 capsule Three times a day as needed for cough    Follow up as scheduled 10/13 with Dr. Delton Coombes with repeat chest x ray. If symptoms do not improve or worsen, please contact office for sooner follow up or seek emergency care.

## 2022-02-15 NOTE — Progress Notes (Signed)
@Patient  ID: , female    DOB: May 22, 1961, 61 y.o.   MRN: [id number]  Chief Complaint  Patient presents with   Follow-up    1wk f/u, pt reports she is feeling better other than some residual tiredness.    Referring provider: [id number], MD  HPI: 61 year old female, former smoker followed for pulmonary nodule and dyspnea. She is a patient of Dr. 46 and last seen in office on 02/08/2022 by [facility] NP. Past medical history significant for HTN, a flutter, obesity.  TEST/EVENTS:  01/03/2022 Super D CT chest: No LAD. Scattered regions of mild cylindrical BTX in both lungs with associated mild patchy tree-in-bud opacities and scattered small solid pulmonary nodules, most proiment in the RUL and RLL. Improved compared to previous. Solid 2 cm RUL nodule decreased from 0.6 cm. 0.3 cm RLL nodule stable. 0.4 cm LLL nodule stable.  02/08/2022 CXR 2 view: There are new patchy airspace opacities in the right lower lung and right upper lobe.  Left lung is clear.  02/08/2022: OV with Rishaan Gunner NP for acute visit. She developed a cough around 2 weeks ago that has persisted. It is dry and causes her chest to hurt when she gets into a coughing spell. She was having some sore throat and phlegm in the back of her throat; thought she may have strep but this has improved. She does still have some nasal congestion and chest congestion. She gets short of breath with coughing spells; otherwise, breathing is at her baseline. She had fevers the end of last week, up to 103. She saw her primary care provider, who is her OBGYN, and sent in z pack for her which she completed Monday. She has not had fevers since Saturday and is not taking any antipyretics. She also was tested for COVID, flu and strep, which were all negative. She denies any hemoptysis, wheezing, anorexia, headaches, leg swelling. No known sick exposures. She has been taking OTC cough syrup, with minimal relief. CXR revealed new patchy consolidations in the RLL  and RUL concerning for pna. She was treated with levaquin course.  02/15/2022: Today - follow up Patient presents today for follow-up after being treated for pneumonia.  She is on her last day of Levaquin.  She completed her prednisone a few days ago.  She is feeling significantly better.  Feels like her breathing is back to her baseline.  She does have an occasional, dry cough but this is much improved.  Denies any persistent chest congestion, recurrent fevers, night sweats, hemoptysis.  She has not had to use her albuterol inhaler recently.  She is still taking the Tessalon Perles 1-2 times a day.  Feels like these worked best for her.  Not having to use the Hydromet cough syrup anymore.   Allergies  Allergen Reactions   Penicillins Anaphylaxis   Other     Immunization History  Administered Date(s) Administered   PFIZER Comirnaty(Gray Top)Covid-19 Tri-Sucrose Vaccine 02/02/2020, 02/23/2020    Past Medical History:  Diagnosis Date   Acute cystitis    Hypertension    Menopausal syndrome    Thyroid disease     Tobacco History: Social History   Tobacco Use  Smoking Status Former   Years: 5.00   Types: Cigarettes   Quit date: 04/22/2017   Years since quitting: 4.8  Smokeless Tobacco Never   Counseling given: Not Answered   Outpatient Medications Prior to Visit  Medication Sig Dispense Refill   albuterol (VENTOLIN HFA) 108 (90 Base)  MCG/ACT inhaler Inhale 2 puffs into the lungs every 6 (six) hours as needed for wheezing or shortness of breath. 8 g 2   benzonatate (TESSALON) 200 MG capsule Take 1 capsule (200 mg total) by mouth 3 (three) times daily as needed for cough. 30 capsule 1   Cyanocobalamin (VITAMIN B-12) 5000 MCG TBDP Take by mouth.     estradiol (ESTRACE) 0.5 MG tablet estradiol 0.5 mg tablet  TAKE 1 TABLET BY MOUTH EVERY DAY     fluticasone (FLONASE) 50 MCG/ACT nasal spray Place 2 sprays into both nostrils daily. 18.2 mL 2   HYDROMET 5-1.5 MG/5ML syrup Take 5 mLs  by mouth 4 (four) times daily as needed.     Ibuprofen (ADVIL PO) Take by mouth as needed.     levothyroxine (SYNTHROID) 100 MCG tablet Take 100 mcg by mouth at bedtime.     losartan (COZAAR) 50 MG tablet Take 50 mg by mouth daily.     medroxyPROGESTERone (PROVERA) 2.5 MG tablet medroxyprogesterone 2.5 mg tablet  TAKE 1 TABLET BY MOUTH EVERY DAY     PARoxetine (PAXIL) 10 MG tablet Take 10 mg by mouth daily.     promethazine-dextromethorphan (PROMETHAZINE-DM) 6.25-15 MG/5ML syrup Take 5 mLs by mouth at bedtime as needed for cough. 80 mL 0   VITAMIN D PO Take by mouth.     predniSONE (DELTASONE) 20 MG tablet Take 40 mg by mouth daily with breakfast.     amoxicillin (AMOXIL) 500 MG tablet Take 500 mg by mouth 3 (three) times daily.     levofloxacin (LEVAQUIN) 500 MG tablet Take 1 tablet (500 mg total) by mouth daily for 7 days. (Patient not taking: Reported on 02/15/2022) 7 tablet 0   No facility-administered medications prior to visit.     Review of Systems:   Constitutional: No weight loss or gain, night sweats, chills, fevers,  lassitude. +fatigue (improving) HEENT: No headaches, difficulty swallowing, tooth/dental problems. No sneezing, itching, ear ache. No nasal congestion, post nasal drip, sore throat, voice hoarseness CV:  No chest pain, orthopnea, PND, swelling in lower extremities, anasarca, dizziness, palpitations, syncope Resp: +occasional dry cough (improved). No shortness of breath. No excess mucus or change in color of mucus. No wheezing. No hemoptysis. No chest wall deformity GI:  No heartburn, indigestion, abdominal pain, nausea, vomiting, diarrhea, change in bowel habits, loss of appetite, bloody stools.  GU: No dysuria, change in color of urine, urgency or frequency.  No flank pain, no hematuria  Skin: No rash, lesions, ulcerations MSK:  No joint pain or swelling.  No decreased range of motion.  No back pain. Neuro: No dizziness or lightheadedness.  Psych: No depression or  anxiety. Mood stable.     Physical Exam:  BP 120/70   Pulse 75   Ht 5\' 5"  (1.651 m)   Wt 191 lb 9.6 oz (86.9 kg)   SpO2 95%   BMI 31.88 kg/m   GEN: Pleasant, interactive, well-kempt; in no acute distress HEENT:  Normocephalic and atraumatic. PERRLA. Sclera white. Nasal turbinates pink, moist and patent bilaterally. No rhinorrhea present. Oropharynx pink and moist, without exudate or edema. No lesions, ulcerations NECK:  Supple w/ fair ROM. No JVD present. Normal carotid impulses w/o bruits. Thyroid symmetrical with no goiter or nodules palpated. No lymphadenopathy.   CV: RRR, no m/r/g, no peripheral edema. Pulses intact, +2 bilaterally. No cyanosis, pallor or clubbing. PULMONARY:  Unlabored, regular breathing. Clear bilaterally A&P w/o wheezes/rales/rhonchi. No accessory muscle use. No dullness to percussion. GI:  BS present and normoactive. Soft, non-tender to palpation.  MSK: No erythema, warmth or tenderness.  Neuro: A/Ox3. No focal deficits noted.   Skin: Warm, no lesions or rashe Psych: Normal affect and behavior. Judgement and thought content appropriate.     Lab Results:  CBC    Component Value Date/Time   WBC 6.8 09/27/2020 2230   RBC 4.61 09/27/2020 2230   HGB 14.4 09/27/2020 2230   HCT 43.3 09/27/2020 2230   PLT 352 09/27/2020 2230   MCV 93.9 09/27/2020 2230   MCH 31.2 09/27/2020 2230   MCHC 33.3 09/27/2020 2230   RDW 12.9 09/27/2020 2230    BMET    Component Value Date/Time   NA 139 09/27/2020 2230   K 3.7 09/27/2020 2230   CL 105 09/27/2020 2230   CO2 21 (L) 09/27/2020 2230   GLUCOSE 104 (H) 09/27/2020 2230   BUN 15 09/27/2020 2230   CREATININE 0.83 09/27/2020 2230   CALCIUM 9.6 09/27/2020 2230   GFRNONAA >60 09/27/2020 2230    BNP No results found for: "BNP"   Imaging:  DG Chest 2 View  Result Date: 02/08/2022 CLINICAL DATA:  Acute bronchitis.  Nonproductive cough for 2 weeks. EXAM: CHEST - 2 VIEW COMPARISON:  Chest radiograph 09/27/2020 and  chest CT 01/03/2022 FINDINGS: The cardiomediastinal silhouette is unchanged with normal heart size. There are new patchy airspace opacities laterally in the right lower lung, and there is also a small irregular opacity in the right upper lobe. The left lung is clear. No pleural effusion or pneumothorax is identified. No acute osseous abnormality is seen. IMPRESSION: Patchy right lung opacities compatible with pneumonia. Electronically Signed   By: Sebastian Ache M.D.   On: 02/08/2022 12:33          No data to display          No results found for: "NITRICOXIDE"      Assessment & Plan:   CAP (community acquired pneumonia) RLL and RUL pneumonia. She was treated with 7 day course of levaquin. Clinically improved. Plan to repeat imaging in 4-6 weeks at follow up. Provided with return precautions.   Patient Instructions  -Continue Albuterol inhaler 2 puffs every 6 hours as needed for shortness of breath or wheezing. Notify if symptoms persist despite rescue inhaler/neb use.  -Continue Flonase nasal spray 2 sprays each nostril daily for nasal congestion/postnasal drip -Continue tessalon perles (benzonatate) 1 capsule Three times a day as needed for cough    Follow up as scheduled 10/13 with Dr. Delton Coombes with repeat chest x ray. If symptoms do not improve or worsen, please contact office for sooner follow up or seek emergency care.     URI (upper respiratory infection) She is much improved compared to when I saw her on 9/6. Previous COVID/flu negative. Completed abx for CAP and prednisone. Continue supportive care as needed.     I spent 25 minutes of dedicated to the care of this patient on the date of this encounter to include pre-visit review of records, face-to-face time with the patient discussing conditions above, post visit ordering of testing, clinical documentation with the electronic health record, making appropriate referrals as documented, and communicating necessary findings to  members of the patients care team.  Noemi Chapel, NP 02/15/2022  Pt aware and understands NP's role.

## 2022-02-15 NOTE — Assessment & Plan Note (Signed)
She is much improved compared to when I saw her on 9/6. Previous COVID/flu negative. Completed abx for CAP and prednisone. Continue supportive care as needed.

## 2022-02-15 NOTE — Assessment & Plan Note (Signed)
RLL and RUL pneumonia. She was treated with 7 day course of levaquin. Clinically improved. Plan to repeat imaging in 4-6 weeks at follow up. Provided with return precautions.   Patient Instructions  -Continue Albuterol inhaler 2 puffs every 6 hours as needed for shortness of breath or wheezing. Notify if symptoms persist despite rescue inhaler/neb use.  -Continue Flonase nasal spray 2 sprays each nostril daily for nasal congestion/postnasal drip -Continue tessalon perles (benzonatate) 1 capsule Three times a day as needed for cough    Follow up as scheduled 10/13 with Dr. Delton Coombes with repeat chest x ray. If symptoms do not improve or worsen, please contact office for sooner follow up or seek emergency care.

## 2022-02-20 NOTE — Telephone Encounter (Signed)
Phone call is not needed. Patient already spoke to provider. Nothing further needed

## 2022-03-17 ENCOUNTER — Encounter: Payer: Self-pay | Admitting: Emergency Medicine

## 2022-03-17 ENCOUNTER — Ambulatory Visit: Payer: 59 | Admitting: Emergency Medicine

## 2022-03-17 DIAGNOSIS — J479 Bronchiectasis, uncomplicated: Secondary | ICD-10-CM

## 2022-03-17 NOTE — Assessment & Plan Note (Signed)
Very mild bronchiectasis with tree-in-bud opacities right upper and right lower lobes.  Could represent MAIC although we have not been compelled to do cultures to date.  She had a recent upper respiratory infection that did require treatment with antibiotics.  Chest x-ray 02/2022 with possible evolving right lower lobe infiltrate.  I think we should repeat her CT in November to ensure no interval change or progression.  If that film is reassuring then we can determine timing of any follow-up, probably annually for short period time, then plan to follow clinically.  If she has progression micronodular disease or bronchiectasis on upcoming imaging then would consider bronchoscopy for culture data.  Discussed MAIC with her today.

## 2022-03-17 NOTE — Patient Instructions (Signed)
We will plan to repeat your CT scan of the chest in November to follow your bronchiectasis following recent upper respiratory illness. Follow Dr. Lamonte Sakai in November after your CT so we can review the results together.

## 2022-03-17 NOTE — Progress Notes (Signed)
Subjective:    Patient ID: Cindy Williamson, female    DOB: 1960/10/24, 61 y.o.   MRN: [id number]  HPI 61 year old woman, former smoker (~22 pack years, quit 4 yrs ago) with a history of hypertension, hypothyroidism, atrial fibrillation/flutter.  He has exertional SOB when walking 2 miles, has to stop to rest. Able to climb stairs. Has some cough at night, can wake her from sleep. Has GERD, uses nexium / tagamet   She underwent coronary calcium CT chest 01/26/2020 which I have reviewed, showed a calcium score of 0, a few small scattered 1-3 mm pulmonary nodules.  Repeat CT chest 01/04/2021 reviewed by me shows some scattered areas of clustered centrilobular tree-in-bud nodularity.  Some of these areas are improved compared with 01/2020 but some more easily seen for example in the peripheral right upper lobe, anterior right upper lobe, lingula.  Suspicious for [facility].   ROV 03/17/22 --61 year old woman with a history of former tobacco use, hypertension, atrial fibrillation/flutter and hypothyroidism.  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  Waxing and waning on imaging 01/2021.  She was seen in our office 1 month ago with cough, congestion, symptoms consistent with a URI.  Flu, strep and COVID-negative.  She was treated empirically with antibiotics.  Chest x-ray may have shown subtle patchy right sided infiltrates. She has improved from that ill ness. Rare cough. Uses allegra during allergy season.   CT chest 01/03/2022 reviewed by me shows some scattered regions of mild cylindrical bronchiectasis bilaterally with some mild patchy tree-in-bud opacities most notable in the right upper and lower lobes.  Overall improved compared with 1 year prior   Review of Systems As per HPI   Past Medical History:  Diagnosis Date   Acute cystitis    Hypertension    Menopausal syndrome    Thyroid disease      Family  History  Problem Relation Age of Onset   COPD Father    Atrial fibrillation Brother     Brother >> RA Father >> lung CA  Social History   Socioeconomic History   Marital status: Married    Spouse name: Not on file   Number of children: Not on file   Years of education: Not on file   Highest education level: Not on file  Occupational History   Not on file  Tobacco Use   Smoking status: Former    Years: 5.00    Types: Cigarettes    Quit date: 04/22/2017    Years since quitting: 4.9   Smokeless tobacco: Never  Substance and Sexual Activity   Alcohol use: Not on file    Comment: occ   Drug use: No   Sexual activity: Not on file  Other Topics Concern   Not on file  Social History Narrative   Not on file   Social Determinants of Health   Financial Resource Strain: Not on file  Food Insecurity: Not on file  Transportation Needs: Not on file  Physical Activity: Not on file  Stress: Not on file  Social Connections: Not on file  Intimate Partner Violence: Not on file    From [place],  Works in Phot[employer] No inhaled exposures No mold.  Never birds.    Allergies  Allergen Reactions   Penicillins Anaphylaxis   Other      Outpatient Medications Prior to Visit  Medication Sig Dispense Refill   albuterol (VENTOLIN HFA) 108 (  90 Base) MCG/ACT inhaler Inhale 2 puffs into the lungs every 6 (six) hours as needed for wheezing or shortness of breath. 8 g 2   benzonatate (TESSALON) 200 MG capsule Take 1 capsule (200 mg total) by mouth 3 (three) times daily as needed for cough. 30 capsule 1   Cyanocobalamin (VITAMIN B-12) 5000 MCG TBDP Take by mouth.     estradiol (ESTRACE) 0.5 MG tablet estradiol 0.5 mg tablet  TAKE 1 TABLET BY MOUTH EVERY DAY     fluticasone (FLONASE) 50 MCG/ACT nasal spray Place 2 sprays into both nostrils daily. 18.2 mL 2   HYDROMET 5-1.5 MG/5ML syrup Take 5 mLs by mouth 4 (four) times daily as needed.     Ibuprofen (ADVIL PO) Take by mouth as needed.      levothyroxine (SYNTHROID) 100 MCG tablet Take 100 mcg by mouth at bedtime.     losartan (COZAAR) 50 MG tablet Take 50 mg by mouth daily.     medroxyPROGESTERone (PROVERA) 2.5 MG tablet medroxyprogesterone 2.5 mg tablet  TAKE 1 TABLET BY MOUTH EVERY DAY     PARoxetine (PAXIL) 10 MG tablet Take 10 mg by mouth daily.     promethazine-dextromethorphan (PROMETHAZINE-DM) 6.25-15 MG/5ML syrup Take 5 mLs by mouth at bedtime as needed for cough. 80 mL 0   VITAMIN D PO Take by mouth.     No facility-administered medications prior to visit.        Objective:   Physical Exam  Vitals:   03/17/22 0851  BP: 126/72  Pulse: 62  Temp: 97.7 F (36.5 C)  TempSrc: Oral  SpO2: 100%  Weight: 190 lb 12.8 oz (86.5 kg)  Height: 5\' 5"  (1.651 m)   Gen: Pleasant, well-nourished, in no distress,  normal affect  ENT: No lesions,  mouth clear,  oropharynx clear, no postnasal drip  Neck: No JVD, no stridor  Lungs: No use of accessory muscles, no crackles or wheezing on normal respiration, no wheeze on forced expiration  Cardiovascular: RRR, heart sounds normal, no murmur or gallops, no peripheral edema  Musculoskeletal: No deformities, no cyanosis or clubbing  Neuro: alert, awake, non focal  Skin: Warm, no lesions or rash     Assessment & Plan:  Bronchiectasis without complication (HCC) Very mild bronchiectasis with tree-in-bud opacities right upper and right lower lobes.  Could represent MAIC although we have not been compelled to do cultures to date.  She had a recent upper respiratory infection that did require treatment with antibiotics.  Chest x-ray 02/2022 with possible evolving right lower lobe infiltrate.  I think we should repeat her CT in November to ensure no interval change or progression.  If that film is reassuring then we can determine timing of any follow-up, probably annually for short period time, then plan to follow clinically.  If she has progression micronodular disease or  bronchiectasis on upcoming imaging then would consider bronchoscopy for culture data.  Discussed MAIC with her today.   Baltazar Apo, MD, PhD 03/17/2022, 9:16 AM [facility] [phone] or if no answer before 7:00PM call [phone] For any issues after 7:00PM please call eLink [phone]

## 2022-03-17 NOTE — Addendum Note (Signed)
Addended byOralia Rud M on: 03/17/2022 09:30 AM   Modules accepted: Orders

## 2022-03-20 ENCOUNTER — Other Ambulatory Visit (HOSPITAL_COMMUNITY): Payer: Self-pay

## 2022-03-20 MED ORDER — TECHLITE PEN NEEDLES 32G X 4 MM MISC
3 refills | Status: AC
  Filled 2022-03-20: qty 100, 31d supply, fill #0
  Filled 2022-05-30: qty 100, 31d supply, fill #1
  Filled 2022-08-25: qty 100, 31d supply, fill #2

## 2022-03-20 MED ORDER — SAXENDA 18 MG/3ML ~~LOC~~ SOPN
0.6000 mg | PEN_INJECTOR | Freq: Every day | SUBCUTANEOUS | 5 refills | Status: AC
  Filled 2022-03-20: qty 3, 21d supply, fill #0
  Filled 2022-04-24 – 2022-08-25 (×5): qty 3, 21d supply, fill #1

## 2022-04-18 ENCOUNTER — Ambulatory Visit
Admission: RE | Admit: 2022-04-18 | Discharge: 2022-04-18 | Disposition: A | Discharge status: Home / Self Care | Payer: 59 | Source: Ambulatory Visit | Attending: Emergency Medicine | Admitting: Emergency Medicine

## 2022-04-18 DIAGNOSIS — J479 Bronchiectasis, uncomplicated: Secondary | ICD-10-CM

## 2022-04-24 ENCOUNTER — Other Ambulatory Visit (HOSPITAL_COMMUNITY): Payer: Self-pay

## 2022-04-26 ENCOUNTER — Other Ambulatory Visit (HOSPITAL_COMMUNITY): Payer: Self-pay

## 2022-04-26 MED ORDER — SAXENDA 18 MG/3ML ~~LOC~~ SOPN
3.0000 mg | PEN_INJECTOR | Freq: Every day | SUBCUTANEOUS | 5 refills | Status: AC
  Filled 2022-04-26: qty 15, 30d supply, fill #0
  Filled 2022-05-30: qty 15, 30d supply, fill #1
  Filled 2022-06-28: qty 15, 30d supply, fill #2
  Filled 2022-07-31: qty 15, 30d supply, fill #3
  Filled 2022-09-01: qty 15, 30d supply, fill #4

## 2022-04-26 NOTE — Progress Notes (Signed)
LMVM for patient to call back regarding CT results.

## 2022-05-04 ENCOUNTER — Ambulatory Visit: Payer: 59 | Admitting: Emergency Medicine

## 2022-05-04 ENCOUNTER — Encounter: Payer: Self-pay | Admitting: Emergency Medicine

## 2022-05-04 VITALS — BP 124/72 | HR 62 | Temp 98.2°F | Ht 66.0 in | Wt 181.6 lb

## 2022-05-04 DIAGNOSIS — R918 Other nonspecific abnormal finding of lung field: Secondary | ICD-10-CM

## 2022-05-04 NOTE — Patient Instructions (Signed)
We will plan to repeat your CT scan of the chest in mid February 2024. Please call let us know if you have any flaring symptoms particularly cough, shortness of breath Follow Dr. Delton Coombes in February after CT so we can review.  Depending on results we will decide whether any other work-up is indicated including possible bronchoscopy.

## 2022-05-04 NOTE — Progress Notes (Signed)
Subjective:    Patient ID: Cindy Williamson, female    DOB: [birth year], 61 y.o.   MRN: [id number]  HPI 61 year old woman, former smoker (~22 pack years, quit 4 yrs ago) with a history of hypertension, hypothyroidism, atrial fibrillation/flutter.  He has exertional SOB when walking 2 miles, has to stop to rest. Able to climb stairs. Has some cough at night, can wake her from sleep. Has GERD, uses nexium / tagamet   She underwent coronary calcium CT chest 01/26/2020 which I have reviewed, showed a calcium score of 0, a few small scattered 1-3 mm pulmonary nodules.  Repeat CT chest 01/04/2021 reviewed by me shows some scattered areas of clustered centrilobular tree-in-bud nodularity.  Some of these areas are improved compared with 01/2020 but some more easily seen for example in the peripheral right upper lobe, anterior right upper lobe, lingula.  Suspicious for [facility].   ROV 03/17/22 --61 year old woman with a history of former tobacco use, hypertension, atrial fibrillation/flutter and hypothyroidism.  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  Waxing and waning on imaging 01/2021.  She was seen in our office 1 month ago with cough, congestion, symptoms consistent with a URI.  Flu, strep and COVID-negative.  She was treated empirically with antibiotics.  Chest x-ray may have shown subtle patchy right sided infiltrates. She has improved from that ill ness. Rare cough. Uses allegra during allergy season.   CT chest 01/03/2022 reviewed by me shows some scattered regions of mild cylindrical bronchiectasis bilaterally with some mild patchy tree-in-bud opacities most notable in the right upper and lower lobes.  Overall improved compared with 1 year prior  ROV 05/04/2022 --follow-up visit 61 year old woman whom I have seen for bronchiectasis and mild patchy tree-in-bud opacities suspicious for [facility].  These have been waxing and  waning on serial imaging.  She was seen in our office earlier this year with upper respiratory symptoms and required antibiotics.  Chest x-ray with a possible evolving right lower lobe infiltrate.  We repeated her CT chest as below.  She feels better, breathing well. Still has some spells of coughing but usually not present.   CT chest 04/18/2022 reviewed by me, shows persistent scattered mild cylindrical bronchiectasis and airways inflammation with increased peribronchovascular micro macro nodularity most evident in the right middle lobe, posterior left upper lobe.   Review of Systems As per HPI   Past Medical History:  Diagnosis Date   Acute cystitis    Hypertension    Menopausal syndrome    Thyroid disease      Family History  Problem Relation Age of Onset   COPD Father    Atrial fibrillation Brother     Brother >> RA Father >> lung CA  Social History   Socioeconomic History   Marital status: Married    Spouse name: Not on file   Number of children: Not on file   Years of education: Not on file   Highest education level: Not on file  Occupational History   Not on file  Tobacco Use   Smoking status: Former    Years: 5.00    Types: Cigarettes    Quit date: 04/22/2017    Years since quitting: 5.0   Smokeless tobacco: Never  Substance and Sexual Activity   Alcohol use: Not on file    Comment: occ   Drug use: No   Sexual activity: Not on file  Other Topics  Concern   Not on file  Social History Narrative   Not on file   Social Determinants of Health   Financial Resource Strain: Not on file  Food Insecurity: Not on file  Transportation Needs: Not on file  Physical Activity: Not on file  Stress: Not on file  Social Connections: Not on file  Intimate Partner Violence: Not on file    From [place],  Works in Banking No inhaled exposures No mold.  Never birds.    Allergies  Allergen Reactions   Penicillins Anaphylaxis   Other      Outpatient Medications  Prior to Visit  Medication Sig Dispense Refill   benzonatate (TESSALON) 200 MG capsule Take 1 capsule (200 mg total) by mouth 3 (three) times daily as needed for cough. 30 capsule 1   Cyanocobalamin (VITAMIN B-12) 5000 MCG TBDP Take by mouth.     estradiol (ESTRACE) 0.5 MG tablet estradiol 0.5 mg tablet  TAKE 1 TABLET BY MOUTH EVERY DAY     fluticasone (FLONASE) 50 MCG/ACT nasal spray Place 2 sprays into both nostrils daily. 18.2 mL 2   HYDROMET 5-1.5 MG/5ML syrup Take 5 mLs by mouth 4 (four) times daily as needed.     Ibuprofen (ADVIL PO) Take by mouth as needed.     Insulin Pen Needle (TECHLITE PEN NEEDLES) 32G X 4 MM MISC Use to inject Saxenda once daily 100 each 3   levothyroxine (SYNTHROID) 100 MCG tablet Take 100 mcg by mouth at bedtime.     Liraglutide -Weight Management (SAXENDA) 18 MG/3ML SOPN Inject 0.6 mg into the skin daily. Increase by 0.6mg 's every 2 weeks 3 mL 5   losartan (COZAAR) 50 MG tablet Take 50 mg by mouth daily.     medroxyPROGESTERone (PROVERA) 2.5 MG tablet medroxyprogesterone 2.5 mg tablet  TAKE 1 TABLET BY MOUTH EVERY DAY     PARoxetine (PAXIL) 10 MG tablet Take 10 mg by mouth daily.     promethazine-dextromethorphan (PROMETHAZINE-DM) 6.25-15 MG/5ML syrup Take 5 mLs by mouth at bedtime as needed for cough. 80 mL 0   SAXENDA 18 MG/3ML SOPN Inject 3 mg into the skin daily before breakfast. 15 mL 5   VITAMIN D PO Take by mouth.     albuterol (VENTOLIN HFA) 108 (90 Base) MCG/ACT inhaler Inhale 2 puffs into the lungs every 6 (six) hours as needed for wheezing or shortness of breath. (Patient not taking: Reported on 05/04/2022) 8 g 2   No facility-administered medications prior to visit.        Objective:   Physical Exam  Vitals:   05/04/22 0920  BP: 124/72  Pulse: 62  Temp: 98.2 F (36.8 C)  TempSrc: Oral  SpO2: 100%  Weight: 181 lb 9.6 oz (82.4 kg)  Height: 5\' 6"  (1.676 m)   Gen: Pleasant, well-nourished, in no distress,  normal affect  ENT: No  lesions,  mouth clear,  oropharynx clear, no postnasal drip  Neck: No JVD, no stridor  Lungs: No use of accessory muscles, no crackles or wheezing on normal respiration, no wheeze on forced expiration  Cardiovascular: RRR, heart sounds normal, no murmur or gallops, no peripheral edema  Musculoskeletal: No deformities, no cyanosis or clubbing  Neuro: alert, awake, non focal  Skin: Warm, no lesions or rash     Assessment & Plan:  Pulmonary nodules Right middle lobe and lingular clustered pulmonary nodules associated with some mild bronchiectatic change and mucous plugging.  Most consistent with MAIC.  Inconsistent with malignancy particularly  based on waxing and waning pattern on serial imaging.  No history of autoimmune disease, will consider checking autoimmune labs depending on course.  I have recommended a repeat CT chest in 3 months.  Depending on that result we will decide about an autoimmune work-up, bronchoscopy for biopsies and culture data.  She agrees with the plan.  We will plan to repeat your CT scan of the chest in mid February 2024. Please call let us know if you have any flaring symptoms particularly cough, shortness of breath Follow Dr. Delton Coombes in February after CT so we can review.  Depending on results we will decide whether any other work-up is indicated including possible bronchoscopy.  Time spent 30 minutes  Levy Pupa, MD, PhD 05/04/2022, 9:54 AM [facility] [phone] or if no answer before 7:00PM call [phone] For any issues after 7:00PM please call eLink [phone]

## 2022-05-04 NOTE — Assessment & Plan Note (Signed)
Right middle lobe and lingular clustered pulmonary nodules associated with some mild bronchiectatic change and mucous plugging.  Most consistent with MAIC.  Inconsistent with malignancy particularly based on waxing and waning pattern on serial imaging.  No history of autoimmune disease, will consider checking autoimmune labs depending on course.  I have recommended a repeat CT chest in 3 months.  Depending on that result we will decide about an autoimmune work-up, bronchoscopy for biopsies and culture data.  She agrees with the plan.  We will plan to repeat your CT scan of the chest in mid February 2024. Please call let us know if you have any flaring symptoms particularly cough, shortness of breath Follow Dr. Delton Coombes in February after CT so we can review.  Depending on results we will decide whether any other work-up is indicated including possible bronchoscopy.

## 2022-05-30 ENCOUNTER — Other Ambulatory Visit: Payer: Self-pay

## 2022-05-30 ENCOUNTER — Other Ambulatory Visit (HOSPITAL_COMMUNITY): Payer: Self-pay

## 2022-06-28 ENCOUNTER — Other Ambulatory Visit: Payer: Self-pay

## 2022-07-17 ENCOUNTER — Other Ambulatory Visit: Payer: 59

## 2022-07-27 ENCOUNTER — Other Ambulatory Visit: Payer: 59

## 2022-07-31 ENCOUNTER — Other Ambulatory Visit: Payer: Self-pay

## 2022-08-23 ENCOUNTER — Ambulatory Visit
Admission: RE | Admit: 2022-08-23 | Discharge: 2022-08-23 | Disposition: A | Discharge status: Home / Self Care | Payer: 59 | Source: Ambulatory Visit | Attending: Emergency Medicine | Admitting: Emergency Medicine

## 2022-08-23 DIAGNOSIS — R918 Other nonspecific abnormal finding of lung field: Secondary | ICD-10-CM

## 2022-08-28 ENCOUNTER — Other Ambulatory Visit: Payer: Self-pay

## 2022-08-30 ENCOUNTER — Ambulatory Visit: Payer: 59 | Admitting: Emergency Medicine

## 2022-08-30 ENCOUNTER — Encounter: Payer: Self-pay | Admitting: Emergency Medicine

## 2022-08-30 VITALS — BP 128/74 | HR 68 | Temp 98.6°F | Ht 65.0 in | Wt 171.8 lb

## 2022-08-30 DIAGNOSIS — J479 Bronchiectasis, uncomplicated: Secondary | ICD-10-CM

## 2022-08-30 NOTE — Assessment & Plan Note (Signed)
Very subtle bronchiectasis with some tree-in-bud nodularity that has been waxing and waning.  Suspicious for [facility].  She is completely asymptomatic.  Given the stability of the CT and lack of symptoms I think we can continue to follow conservatively.  We will repeat her CT chest in 08/2023.  Certainly if she develops symptoms or if any evidence of systemic inflammatory process or autoimmune disease then we would investigate sooner.  We reviewed your CT scan of the chest today.  There is been no significant change and very subtle inflammation in the airways.  Good news. We will plan to repeat your CT chest in March 2025 Please call us sooner if you develop any changes in breathing, cough, mucus production or any other concerning symptoms. Follow Dr. Lamonte Sakai in March 2025 after your CT so we can review the results together.

## 2022-08-30 NOTE — Addendum Note (Signed)
Addended by: Dierdre Highman on: 08/30/2022 09:41 AM   Modules accepted: Orders

## 2022-08-30 NOTE — Patient Instructions (Signed)
We reviewed your CT scan of the chest today.  There is been no significant change and very subtle inflammation in the airways.  Good news. We will plan to repeat your CT chest in March 2025 Please call us sooner if you develop any changes in breathing, cough, mucus production or any other concerning symptoms. Follow Dr. Lamonte Sakai in March 2025 after your CT so we can review the results together.

## 2022-08-30 NOTE — Progress Notes (Signed)
Subjective:    Patient ID: Cindy Williamson, female    DOB: [birth year], 62 y.o.   MRN: [id number]  HPI  ROV 05/04/2022 --follow-up visit 62 year old woman whom I have seen for bronchiectasis and mild patchy tree-in-bud opacities suspicious for [facility].  These have been waxing and waning on serial imaging.  She was seen in our office earlier this year with upper respiratory symptoms and required antibiotics.  Chest x-ray with a possible evolving right lower lobe infiltrate.  We repeated her CT chest as below.  She feels better, breathing well. Still has some spells of coughing but usually not present.   CT chest 04/18/2022 reviewed by me, shows persistent scattered mild cylindrical bronchiectasis and airways inflammation with increased peribronchovascular micro macro nodularity most evident in the right middle lobe, posterior left upper lobe.  ROV 08/30/22 --62 year old woman with a history of bronchiectasis, mild patchy tree-in-bud opacities suspicious for MAIC (not proven).  Last seen in November after a repeat CT chest.  No history of autoimmune disease.  Today she reports that she is doing quite well. No dyspnea no cough. She is active.   CT chest 08/23/2022 reviewed by me, shows waxing and waning mild to moderate patchy tree-in-bud nodularity, right greater than left.  Particularly notable in the peripheral right upper lobe, anterior right lower lobe.  Stable minimal cylindrical bronchiectatic change.   Review of Systems As per HPI   Past Medical History:  Diagnosis Date   Acute cystitis    Hypertension    Menopausal syndrome    Thyroid disease      Family History  Problem Relation Age of Onset   COPD Father    Atrial fibrillation Brother     Brother >> RA Father >> lung CA  Social History   Socioeconomic History   Marital status: Married    Spouse name: Not on file   Number of children: Not on file   Years of education: Not on file   Highest education level: Not on file   Occupational History   Not on file  Tobacco Use   Smoking status: Former    Years: 5    Types: Cigarettes    Quit date: 04/22/2017    Years since quitting: 5.3   Smokeless tobacco: Never  Substance and Sexual Activity   Alcohol use: Not on file    Comment: occ   Drug use: No   Sexual activity: Not on file  Other Topics Concern   Not on file  Social History Narrative   Not on file   Social Determinants of Health   Financial Resource Strain: Not on file  Food Insecurity: Not on file  Transportation Needs: Not on file  Physical Activity: Not on file  Stress: Not on file  Social Connections: Not on file  Intimate Partner Violence: Not on file    From [place],  Works in Science writer No inhaled exposures No mold.  Never birds.    Allergies  Allergen Reactions   Penicillins Anaphylaxis   Other      Outpatient Medications Prior to Visit  Medication Sig Dispense Refill   albuterol (VENTOLIN HFA) 108 (90 Base) MCG/ACT inhaler Inhale 2 puffs into the lungs every 6 (six) hours as needed for wheezing or shortness of breath. 8 g 2   benzonatate (TESSALON) 200 MG capsule Take 1 capsule (200 mg total) by mouth 3 (three) times daily as needed for cough. 30 capsule 1   Cyanocobalamin (VITAMIN B-12) 5000 MCG TBDP Take by mouth.  estradiol (ESTRACE) 0.5 MG tablet estradiol 0.5 mg tablet  TAKE 1 TABLET BY MOUTH EVERY DAY     fluticasone (FLONASE) 50 MCG/ACT nasal spray Place 2 sprays into both nostrils daily. 18.2 mL 2   HYDROMET 5-1.5 MG/5ML syrup Take 5 mLs by mouth 4 (four) times daily as needed.     Ibuprofen (ADVIL PO) Take by mouth as needed.     Insulin Pen Needle (TECHLITE PEN NEEDLES) 32G X 4 MM MISC Use to inject Saxenda once daily 100 each 3   levothyroxine (SYNTHROID) 100 MCG tablet Take 100 mcg by mouth at bedtime.     Liraglutide -Weight Management (SAXENDA) 18 MG/3ML SOPN Inject 0.6 mg into the skin daily. Increase by 0.6mg 's every 2 weeks 3 mL 5   losartan (COZAAR) 50 MG  tablet Take 50 mg by mouth daily.     medroxyPROGESTERone (PROVERA) 2.5 MG tablet medroxyprogesterone 2.5 mg tablet  TAKE 1 TABLET BY MOUTH EVERY DAY     PARoxetine (PAXIL) 10 MG tablet Take 10 mg by mouth daily.     promethazine-dextromethorphan (PROMETHAZINE-DM) 6.25-15 MG/5ML syrup Take 5 mLs by mouth at bedtime as needed for cough. 80 mL 0   SAXENDA 18 MG/3ML SOPN Inject 3 mg into the skin daily before breakfast. 15 mL 5   VITAMIN D PO Take by mouth.     No facility-administered medications prior to visit.        Objective:   Physical Exam  Vitals:   08/30/22 0841  BP: 128/74  Pulse: 68  Temp: 98.6 F (37 C)  TempSrc: Oral  SpO2: 97%  Weight: 171 lb 12.8 oz (77.9 kg)  Height: 5\' 5"  (1.651 m)   Gen: Pleasant, well-nourished, in no distress,  normal affect  ENT: No lesions,  mouth clear,  oropharynx clear, no postnasal drip  Neck: No JVD, no stridor  Lungs: No use of accessory muscles, no crackles or wheezing on normal respiration, no wheeze on forced expiration  Cardiovascular: RRR, heart sounds normal, no murmur or gallops, no peripheral edema  Musculoskeletal: No deformities, no cyanosis or clubbing  Neuro: alert, awake, non focal  Skin: Warm, no lesions or rash     Assessment & Plan:  Bronchiectasis without complication (HCC) Very subtle bronchiectasis with some tree-in-bud nodularity that has been waxing and waning.  Suspicious for [facility].  She is completely asymptomatic.  Given the stability of the CT and lack of symptoms I think we can continue to follow conservatively.  We will repeat her CT chest in 08/2023.  Certainly if she develops symptoms or if any evidence of systemic inflammatory process or autoimmune disease then we would investigate sooner.  We reviewed your CT scan of the chest today.  There is been no significant change and very subtle inflammation in the airways.  Good news. We will plan to repeat your CT chest in March 2025 Please call us sooner  if you develop any changes in breathing, cough, mucus production or any other concerning symptoms. Follow Dr. Lamonte Sakai in March 2025 after your CT so we can review the results together.    Baltazar Apo, MD, PhD 08/30/2022, 9:07 AM [facility] [facility] [phone] or if no answer before 7:00PM call [phone] For any issues after 7:00PM please call eLink [phone]

## 2022-09-04 ENCOUNTER — Other Ambulatory Visit (HOSPITAL_COMMUNITY): Payer: Self-pay

## 2022-09-04 ENCOUNTER — Other Ambulatory Visit: Payer: Self-pay

## 2022-09-08 ENCOUNTER — Other Ambulatory Visit (HOSPITAL_COMMUNITY): Payer: Self-pay

## 2022-09-08 MED ORDER — WEGOVY 1 MG/0.5ML ~~LOC~~ SOAJ
1.0000 mg | SUBCUTANEOUS | 0 refills | Status: AC
  Filled 2022-09-08: qty 2, 28d supply, fill #0

## 2022-09-14 ENCOUNTER — Other Ambulatory Visit (HOSPITAL_COMMUNITY): Payer: Self-pay

## 2022-09-18 ENCOUNTER — Other Ambulatory Visit (HOSPITAL_COMMUNITY): Payer: Self-pay

## 2022-09-18 MED ORDER — WEGOVY 1 MG/0.5ML ~~LOC~~ SOAJ
1.0000 mg | SUBCUTANEOUS | 5 refills | Status: AC
  Filled 2022-09-18: qty 2, 2d supply, fill #0
  Filled 2022-10-02: qty 2, 28d supply, fill #0
  Filled 2022-10-28: qty 2, 28d supply, fill #1

## 2022-09-22 ENCOUNTER — Other Ambulatory Visit (HOSPITAL_COMMUNITY): Payer: Self-pay

## 2022-09-24 ENCOUNTER — Other Ambulatory Visit: Payer: Self-pay

## 2022-09-24 ENCOUNTER — Emergency Department (HOSPITAL_BASED_OUTPATIENT_CLINIC_OR_DEPARTMENT_OTHER): Payer: 59

## 2022-09-24 ENCOUNTER — Emergency Department (HOSPITAL_BASED_OUTPATIENT_CLINIC_OR_DEPARTMENT_OTHER): Payer: 59 | Admitting: Radiology

## 2022-09-24 ENCOUNTER — Emergency Department (HOSPITAL_BASED_OUTPATIENT_CLINIC_OR_DEPARTMENT_OTHER)
Admission: EM | Admit: 2022-09-24 | Discharge: 2022-09-24 | Disposition: A | Discharge status: Home / Self Care | Payer: 59 | Attending: Emergency Medicine | Admitting: Emergency Medicine

## 2022-09-24 DIAGNOSIS — S52612A Displaced fracture of left ulna styloid process, initial encounter for closed fracture: Secondary | ICD-10-CM | POA: Insufficient documentation

## 2022-09-24 DIAGNOSIS — I10 Essential (primary) hypertension: Secondary | ICD-10-CM | POA: Diagnosis not present

## 2022-09-24 DIAGNOSIS — S52602A Unspecified fracture of lower end of left ulna, initial encounter for closed fracture: Secondary | ICD-10-CM

## 2022-09-24 DIAGNOSIS — W010XXA Fall on same level from slipping, tripping and stumbling without subsequent striking against object, initial encounter: Secondary | ICD-10-CM | POA: Diagnosis not present

## 2022-09-24 DIAGNOSIS — Z794 Long term (current) use of insulin: Secondary | ICD-10-CM | POA: Insufficient documentation

## 2022-09-24 DIAGNOSIS — Y92511 Restaurant or cafe as the place of occurrence of the external cause: Secondary | ICD-10-CM | POA: Diagnosis not present

## 2022-09-24 DIAGNOSIS — Z79899 Other long term (current) drug therapy: Secondary | ICD-10-CM | POA: Diagnosis not present

## 2022-09-24 DIAGNOSIS — S52532A Colles' fracture of left radius, initial encounter for closed fracture: Secondary | ICD-10-CM | POA: Insufficient documentation

## 2022-09-24 DIAGNOSIS — M7989 Other specified soft tissue disorders: Secondary | ICD-10-CM | POA: Diagnosis not present

## 2022-09-24 DIAGNOSIS — S6992XA Unspecified injury of left wrist, hand and finger(s), initial encounter: Secondary | ICD-10-CM | POA: Diagnosis present

## 2022-09-24 MED ORDER — LIDOCAINE-EPINEPHRINE (PF) 2 %-1:200000 IJ SOLN
20.0000 mL | Freq: Once | INTRAMUSCULAR | Status: AC
  Administered 2022-09-24: 20 mL via INTRADERMAL
  Filled 2022-09-24: qty 20

## 2022-09-24 MED ORDER — OXYCODONE HCL 5 MG PO TABS: 5.0000 mg | ORAL_TABLET | Freq: Four times a day (QID) | ORAL | 0 refills | Status: AC | PRN

## 2022-09-24 MED ORDER — FENTANYL CITRATE PF 50 MCG/ML IJ SOSY
50.0000 ug | PREFILLED_SYRINGE | INTRAMUSCULAR | Status: AC | PRN
  Administered 2022-09-24 (×2): 50 ug via INTRAVENOUS
  Filled 2022-09-24 (×2): qty 1

## 2022-09-24 MED ORDER — ONDANSETRON HCL 4 MG/2ML IJ SOLN
4.0000 mg | Freq: Once | INTRAMUSCULAR | Status: AC
  Administered 2022-09-24: 4 mg via INTRAVENOUS
  Filled 2022-09-24: qty 2

## 2022-09-24 NOTE — ED Triage Notes (Signed)
Fell just PTA. Obvious deformity to left wrist. Some elbow tenderness. Pain 10/10. Denies head trauma, LOC. Slipped.

## 2022-09-24 NOTE — Discharge Instructions (Signed)
Thank you for coming to [facility] Emergency Department. You were seen for fall and wrist pain. We did an exam, labs, and imaging, and these showed a distal radius and ulnar fracture. You had nerve blocks and were reduced in the ED.   You can alternate taking Tylenol and ibuprofen as needed for pain. You can take  tylenol (acetaminophen) every 4-6 hours, and 600 mg ibuprofen 3 times a day. You can also take oxycodone 5 mg every 6 hours as need for severe pain. Please rest, ice, and elevate the left arm to help reduce swelling and pain. You can wear the sling for comfort but it can come off for showers. Please keep the splint clean and dry.  Please follow up with your orthopedic surgeon within 1 week.   Do not hesitate to return to the ED or call 911 if you experience: You have severe pain. You have a severe increase in swelling. Your fingers become very cold or blue. You have numbness, tingling, or loss of feeling in your hands or fingers.

## 2022-09-24 NOTE — ED Provider Notes (Addendum)
[facility] AT [facility] Provider Note   CSN: [id number] Arrival date & time: 09/24/22  1419     History  Chief Complaint  Patient presents with   Fall    Cindy Williamson is a 62 y.o. female with HTN, A flutter, bronchiectasis, who presents with fall.   Patient slipped on a wet floor in a restaurant and FOOSH'd. Immediate pain and obvious deformity to left wrist. Some elbow tenderness. Pain 10/10. Denies head trauma, LOC. Otherwise was in her NSOH. Doesn't take blood thinners.   Fall       Home Medications Prior to Admission medications   Medication Sig Start Date End Date Taking? Authorizing Provider  oxyCODONE (ROXICODONE) 5 MG immediate release tablet Take 1 tablet (5 mg total) by mouth every 6 (six) hours as needed for up to 3 days for severe pain. 09/24/22 09/27/22 Yes Loetta Rough, MD  albuterol (VENTOLIN HFA) 108 (90 Base) MCG/ACT inhaler Inhale 2 puffs into the lungs every 6 (six) hours as needed for wheezing or shortness of breath. 02/08/22   Cobb, Ruby Cola, NP  benzonatate (TESSALON) 200 MG capsule Take 1 capsule (200 mg total) by mouth 3 (three) times daily as needed for cough. 02/08/22   Cobb, Ruby Cola, NP  Cyanocobalamin (VITAMIN B-12) 5000 MCG TBDP Take by mouth.    [provider]  estradiol (ESTRACE) 0.5 MG tablet estradiol 0.5 mg tablet  TAKE 1 TABLET BY MOUTH EVERY DAY    [provider]  fluticasone (FLONASE) 50 MCG/ACT nasal spray Place 2 sprays into both nostrils daily. 02/08/22   Cobb, Ruby Cola, NP  HYDROMET 5-1.5 MG/5ML syrup Take 5 mLs by mouth 4 (four) times daily as needed. 02/07/22   [provider]  Ibuprofen (ADVIL PO) Take by mouth as needed.    [provider]  Insulin Pen Needle (TECHLITE PEN NEEDLES) 32G X 4 MM MISC Use to inject Saxenda once daily 03/20/22     levothyroxine (SYNTHROID) 100 MCG tablet Take 100 mcg by mouth at bedtime.    [provider]  Liraglutide  -Weight Management (SAXENDA) 18 MG/3ML SOPN Inject 0.6 mg into the skin daily. Increase by 0.6mg 's every 2 weeks 03/16/22     losartan (COZAAR) 50 MG tablet Take 50 mg by mouth daily.    [provider]  medroxyPROGESTERone (PROVERA) 2.5 MG tablet medroxyprogesterone 2.5 mg tablet  TAKE 1 TABLET BY MOUTH EVERY DAY    [provider]  PARoxetine (PAXIL) 10 MG tablet Take 10 mg by mouth daily.    [provider]  promethazine-dextromethorphan (PROMETHAZINE-DM) 6.25-15 MG/5ML syrup Take 5 mLs by mouth at bedtime as needed for cough. 02/08/22   Cobb, Ruby Cola, NP  SAXENDA 18 MG/3ML SOPN Inject 3 mg into the skin daily before breakfast. 04/26/22     Semaglutide-Weight Management (WEGOVY) 1 MG/0.5ML SOAJ Inject 1 mg into the skin once a week. 09/07/22     VITAMIN D PO Take by mouth.    [provider]  WEGOVY 1 MG/0.5ML SOAJ Inject 1 mg into the skin once a week. 09/18/22         Allergies    Penicillins and Other    Review of Systems   Review of Systems Review of systems Negative for head trauma, LOC.  A 10 point review of systems was performed and is negative unless otherwise reported in HPI.  Physical Exam Updated Vital Signs BP 128/65   Pulse 77   Temp  98.5 F (36.9 C) (Oral)   Resp 18   SpO2 96%  Physical Exam General: Normal appearing female, lying in bed.  HEENT: Sclera anicteric, MMM, trachea midline. NCAT.  Cardiology: RRR, no murmurs/rubs/gallops. BL radial and DP pulses equal bilaterally.  Resp: Normal respiratory rate and effort. CTAB, no wheezes, rhonchi, crackles.  Abd: Soft, non-tender, non-distended. No rebound tenderness or guarding.  GU: Deferred. MSK: L wrist deformity with significant TTP and mild edema. Intact ROM of L hand, NVI, compartments soft. No peripheral edema. No cyanosis. Good cap refill.  Skin: warm, dry.  Neuro: A&Ox4, CNs II-XII grossly intact. MAEs. Sensation grossly intact.  Psych: Normal mood and affect.   ED  Results / Procedures / Treatments   Labs (all labs ordered are listed, but only abnormal results are displayed) Labs Reviewed - No data to display  EKG None  Radiology No results found.  Procedures .Nerve Block  Date/Time: 09/24/2022 6:32 PM  Performed by: Loetta Rough, MD Authorized by: Loetta Rough, MD   Consent:    Consent obtained:  Verbal   Consent given by:  Patient and spouse   Risks, benefits, and alternatives were discussed: yes     Risks discussed:  Bleeding, allergic reaction, intravenous injection, infection, swelling and unsuccessful block   Alternatives discussed:  No treatment and alternative treatment Universal protocol:    Procedure explained and questions answered to patient or proxy's satisfaction: yes     Imaging studies available: yes     Site/side marked: yes     Immediately prior to procedure, a time out was called: yes     Patient identity confirmed:  Verbally with patient Indications:    Indications:  Procedural anesthesia and pain relief Location:    Body area:  Upper extremity   Upper extremity nerve:  Radial   Laterality:  Left Pre-procedure details:    Skin preparation:  Chlorhexidine Procedure details:    Block needle gauge:  25 G   Guidance: ultrasound     Anesthetic injected:  Lidocaine 2% WITH epi   Injection procedure:  Anatomic landmarks identified, incremental injection, introduced needle, anatomic landmarks palpated and negative aspiration for blood   Paresthesia:  None Post-procedure details:    Outcome:  Pain relieved   Procedure completion:  Tolerated well, no immediate complications .Nerve Block  Date/Time: 09/24/2022 6:33 PM  Performed by: Loetta Rough, MD Authorized by: Loetta Rough, MD   Consent:    Consent obtained:  Verbal   Consent given by:  Patient   Risks, benefits, and alternatives were discussed: yes     Risks discussed:  Allergic reaction, bleeding, intravenous injection, infection, nerve damage,  pain, unsuccessful block and swelling   Alternatives discussed:  No treatment and alternative treatment Universal protocol:    Procedure explained and questions answered to patient or proxy's satisfaction: yes     Relevant documents present and verified: yes     Imaging studies available: yes     Immediately prior to procedure, a time out was called: yes     Patient identity confirmed:  Verbally with patient Indications:    Indications:  Pain relief and procedural anesthesia Location:    Body area:  Upper extremity   Upper extremity nerve blocked: Median.   Laterality:  Left Pre-procedure details:    Skin preparation:  Chlorhexidine Procedure details:    Block needle gauge:  25 G   Guidance: ultrasound     Anesthetic injected:  Lidocaine 2% WITH epi  Additive injected:  None   Injection procedure:  Incremental injection, introduced needle, anatomic landmarks palpated, anatomic landmarks identified and negative aspiration for blood   Paresthesia:  Immediately resolved Post-procedure details:    Dressing:  None   Outcome:  Anesthesia achieved   Procedure completion:  Tolerated Reduction of fracture  Date/Time: 09/24/2022 6:34 PM  Performed by: Loetta Rough, MD Authorized by: Loetta Rough, MD  Consent: Verbal consent obtained. Risks and benefits: risks, benefits and alternatives were discussed Consent given by: patient and spouse Patient understanding: patient states understanding of the procedure being performed Imaging studies: imaging studies available Required items: required blood products, implants, devices, and special equipment available Patient identity confirmed: verbally with patient Time out: Immediately prior to procedure a "time out" was called to verify the correct patient, procedure, equipment, support staff and site/side marked as required. Local anesthesia used: yes Anesthesia: nerve block and hematoma block  Anesthesia: Local anesthesia used:  yes Local Anesthetic: lidocaine 2% with epinephrine Anesthetic total (ml): total between two nerve blocks and hematoma block 15 mL.  Sedation: Patient sedated: no  Patient tolerance: patient tolerated the procedure well with no immediate complications       Medications Ordered in ED Medications  fentaNYL (SUBLIMAZE) injection 50 mcg (50 mcg Intravenous Given 09/24/22 1636)  ondansetron (ZOFRAN) injection 4 mg (4 mg Intravenous Given 09/24/22 1439)  lidocaine-EPINEPHrine (XYLOCAINE W/EPI) 2 %-1:200000 (PF) injection 20 mL (20 mLs Intradermal Given by Other 09/24/22 1514)    ED Course/ Medical Decision Making/ A&P                          Medical Decision Making Amount and/or Complexity of Data Reviewed Radiology: ordered. Decision-making details documented in ED Course.  Risk Prescription drug management.    MDM:    Patient with FOOSH injury w/ obvious L wrist deformity, NVI. Compartments soft, no c/f compartment syndrome. No open wounds. XR demonstrates Colle's fracture. Patient is given fentanyl IV for pain control. She and her husband are consented for radial/median nerve block and hematoma block, as well as the fracture reduction. Radial and median nerve blocks performed w/ ultrasound guidance, as well as a local hematoma block, with excellent anesthesia achieved. Patient was given a total of 15 mL of 2% lido w/ epi, well under the maximum dose. Patient was reduced with post-reduction XR demonstrating improved anatomic alignment. Patient is splinted w/ sugar tong splint and given sling. She is NVI after the reduction and splinting.  She already has a Investment banker, operational, and she states she will call tomorrow morning to make a follow-up appointment. Instructed for prompt follow-up given marked comminution. Patient is given instructions for RICE, instructed to take Tylenol ibuprofen alternating for pain, and is given an oxycodone prescription x 3 days as needed for severe pain.   Specific discharge instructions and return precautions are relayed to patient and her husband, who report understanding.   Clinical Course as of 09/24/22 1842  Sun Sep 24, 2022  1840 DG Wrist Complete Left [HN]  1840 DG Wrist Complete Left FINDINGS: Comminuted fracture of the distal radial metaphysis extends into the joint. Dorsal angulation is noted. Ulnar styloid fracture is present. Carpal bones are intact.  IMPRESSION: 1. Comminuted fracture of the distal radial metaphysis with dorsal angulation. 2. Ulnar styloid fracture.   [HN]  1840 DG Elbow Complete Left FINDINGS: There is no evidence of fracture, dislocation, or joint effusion. There is no evidence of arthropathy  or other focal bone abnormality. Soft tissues are unremarkable.  IMPRESSION: Negative left elbow radiographs.   [HN]  1840 DG Wrist 2 Views Left FINDINGS: There is decreased impaction and angulation of a comminuted fracture of the distal radius. There is slightly decreased displacement of an ulnar styloid fracture.  IMPRESSION: Decreased impaction and angulation of a comminuted fracture of the distal radius. Slightly decreased displacement of an ulnar styloid fracture.   [HN]    Clinical Course User Index [HN] Loetta Rough, MD    Imaging Studies ordered: I ordered imaging studies including L wrist, L elbow XR I independently visualized and interpreted imaging. I agree with the radiologist interpretation  Additional history obtained from husband at bedside, chart review.    Reevaluation: After the interventions noted above, I reevaluated the patient and found that they have :improved  Social Determinants of Health: Patient lives independently   Disposition:  DC w/ discharge instructions/return precautions. All questions answered to patient's satisfaction.    Co morbidities that complicate the patient evaluation  Past Medical History:  Diagnosis Date   Acute cystitis    Hypertension     Menopausal syndrome    Thyroid disease      Medicines Meds ordered this encounter  Medications   fentaNYL (SUBLIMAZE) injection 50 mcg   ondansetron (ZOFRAN) injection 4 mg   lidocaine-EPINEPHrine (XYLOCAINE W/EPI) 2 %-1:200000 (PF) injection 20 mL   oxyCODONE (ROXICODONE) 5 MG immediate release tablet    Sig: Take 1 tablet (5 mg total) by mouth every 6 (six) hours as needed for up to 3 days for severe pain.    Dispense:  12 tablet    Refill:  0    I have reviewed the patients home medicines and have made adjustments as needed  Problem List / ED Course: Problem List Items Addressed This Visit   None Visit Diagnoses     Closed Colles' fracture of left radius, initial encounter    -  Primary   Closed fracture of distal end of left ulna, unspecified fracture morphology, initial encounter               Loetta Rough, MD 09/24/22 1845

## 2022-10-02 ENCOUNTER — Other Ambulatory Visit (HOSPITAL_COMMUNITY): Payer: Self-pay

## 2022-10-24 ENCOUNTER — Other Ambulatory Visit: Payer: Self-pay | Admitting: Obstetrics & Gynecology

## 2022-10-24 DIAGNOSIS — Z1231 Encounter for screening mammogram for malignant neoplasm of breast: Secondary | ICD-10-CM

## 2022-10-28 ENCOUNTER — Other Ambulatory Visit (HOSPITAL_COMMUNITY): Payer: Self-pay

## 2022-11-02 ENCOUNTER — Ambulatory Visit
Admission: RE | Admit: 2022-11-02 | Discharge: 2022-11-02 | Disposition: A | Discharge status: Home / Self Care | Payer: 59 | Source: Ambulatory Visit | Attending: Obstetrics & Gynecology | Admitting: Obstetrics & Gynecology

## 2022-11-02 DIAGNOSIS — Z1231 Encounter for screening mammogram for malignant neoplasm of breast: Secondary | ICD-10-CM

## 2022-11-29 ENCOUNTER — Other Ambulatory Visit (HOSPITAL_COMMUNITY): Payer: Self-pay

## 2022-11-29 MED ORDER — WEGOVY 1.7 MG/0.75ML ~~LOC~~ SOAJ
1.7000 mg | SUBCUTANEOUS | 1 refills | Status: DC
  Filled 2022-11-29: qty 3, 28d supply, fill #0
  Filled 2022-12-23: qty 3, 28d supply, fill #1

## 2022-11-30 ENCOUNTER — Other Ambulatory Visit (HOSPITAL_COMMUNITY): Payer: Self-pay

## 2023-01-10 ENCOUNTER — Other Ambulatory Visit (HOSPITAL_COMMUNITY): Payer: Self-pay

## 2023-01-10 MED ORDER — WEGOVY 1.7 MG/0.75ML ~~LOC~~ SOAJ
1.7000 mg | SUBCUTANEOUS | 1 refills | Status: DC
  Filled 2023-01-15 – 2023-01-18 (×2): qty 3, 28d supply, fill #0
  Filled 2023-02-06 – 2023-02-26 (×3): qty 3, 28d supply, fill #1

## 2023-01-15 ENCOUNTER — Other Ambulatory Visit (HOSPITAL_COMMUNITY): Payer: Self-pay

## 2023-01-18 ENCOUNTER — Other Ambulatory Visit (HOSPITAL_COMMUNITY): Payer: Self-pay

## 2023-02-07 ENCOUNTER — Other Ambulatory Visit: Payer: Self-pay

## 2023-02-26 ENCOUNTER — Other Ambulatory Visit (HOSPITAL_COMMUNITY): Payer: Self-pay

## 2023-03-21 ENCOUNTER — Other Ambulatory Visit (HOSPITAL_COMMUNITY): Payer: Self-pay

## 2023-03-21 MED ORDER — WEGOVY 1.7 MG/0.75ML ~~LOC~~ SOAJ
1.7000 mg | SUBCUTANEOUS | 1 refills | Status: DC
  Filled 2023-03-21: qty 3, 28d supply, fill #0
  Filled 2023-04-18: qty 3, 28d supply, fill #1

## 2023-03-23 ENCOUNTER — Other Ambulatory Visit (HOSPITAL_COMMUNITY): Payer: Self-pay

## 2023-05-14 ENCOUNTER — Other Ambulatory Visit (HOSPITAL_COMMUNITY): Payer: Self-pay

## 2023-05-14 MED ORDER — WEGOVY 1.7 MG/0.75ML ~~LOC~~ SOAJ
1.7000 mg | SUBCUTANEOUS | 1 refills | Status: AC
  Filled 2023-05-14: qty 3, 28d supply, fill #0
  Filled 2023-06-11: qty 3, 28d supply, fill #1

## 2023-05-18 ENCOUNTER — Other Ambulatory Visit (HOSPITAL_COMMUNITY): Payer: Self-pay

## 2023-05-18 MED ORDER — CEPHALEXIN 500 MG PO CAPS
500.0000 mg | ORAL_CAPSULE | Freq: Three times a day (TID) | ORAL | 0 refills | Status: AC
Start: 2023-05-17 — End: 2023-05-25
  Filled 2023-05-18: qty 21, 7d supply, fill #0

## 2023-06-03 IMAGING — CT CT CHEST W/O CM
1 series · 15 of 34 positions shown, 19 images · non-contrast
Comparison: 01/26/2020

CLINICAL DATA: Follow-up pulmonary nodules

EXAM:
CT CHEST WITHOUT CONTRAST
TECHNIQUE: Multidetector CT imaging of the chest was performed following the
standard protocol without IV contrast.

[Series 2: chest w/(date) · axial · 0.72mm/px · z∈[-308,-18]mm · 15 of 171 slices shown, 19 images]
[im 13/171  mediastinal]
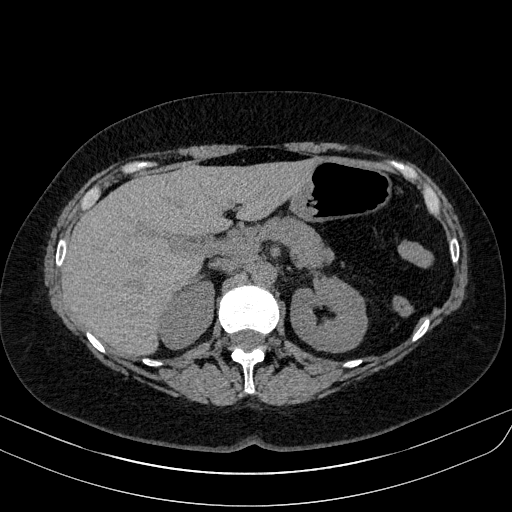
[im 13/171  lung]
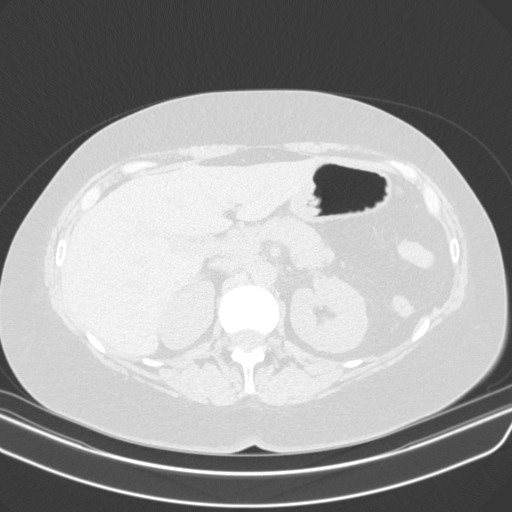
[im 26/171  lung]
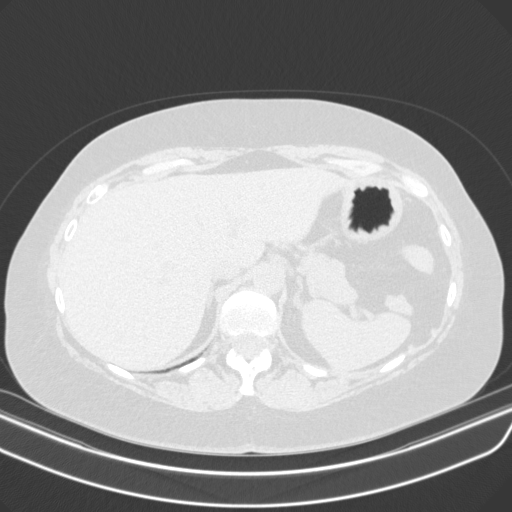
[im 35/171  lung]
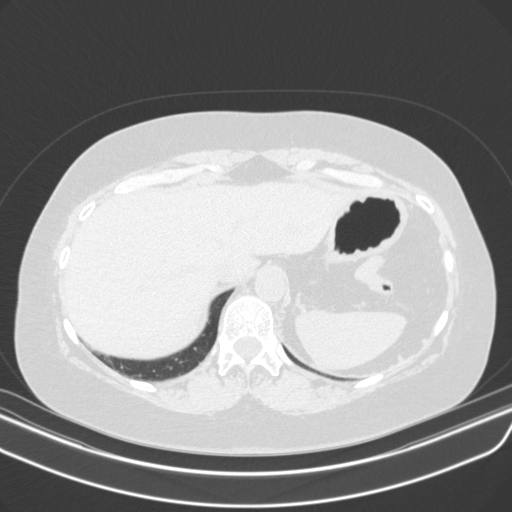
[im 45/171  lung]
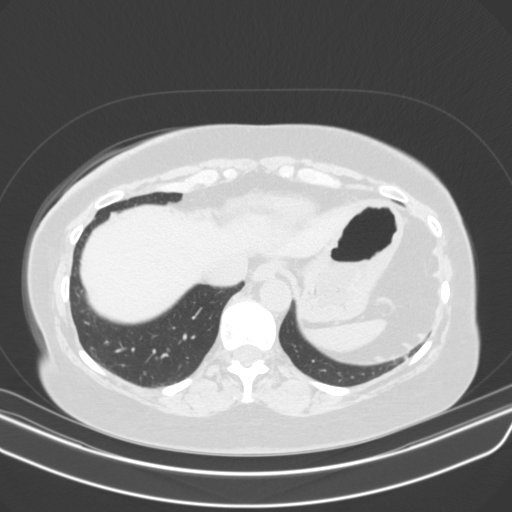
[im 57/171  mediastinal]
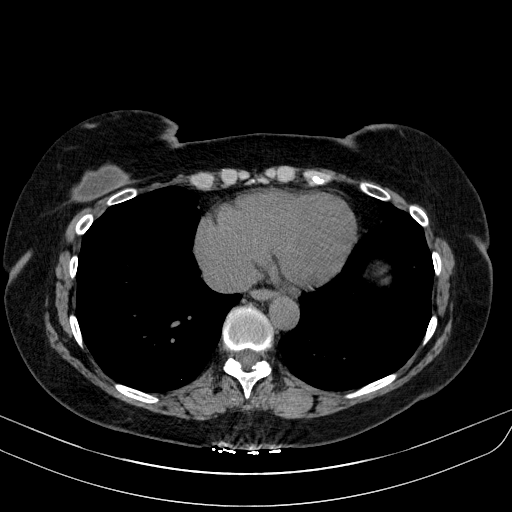
[im 57/171  lung]
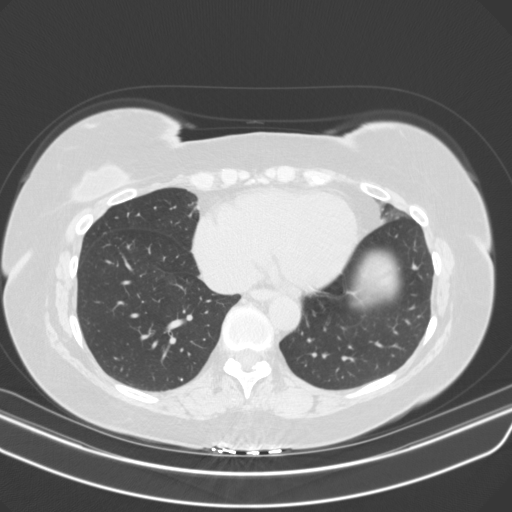
[im 69/171  lung]
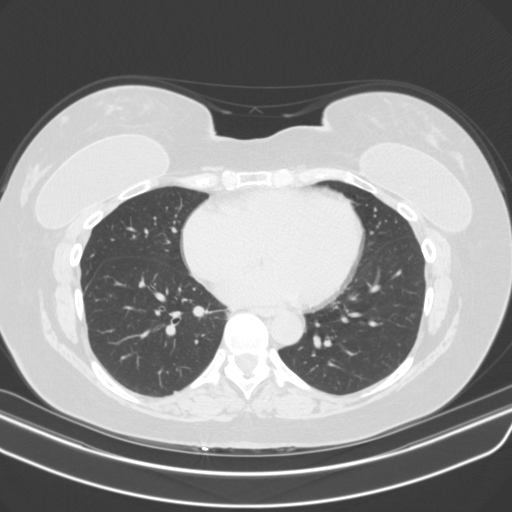
[im 76/171  lung]
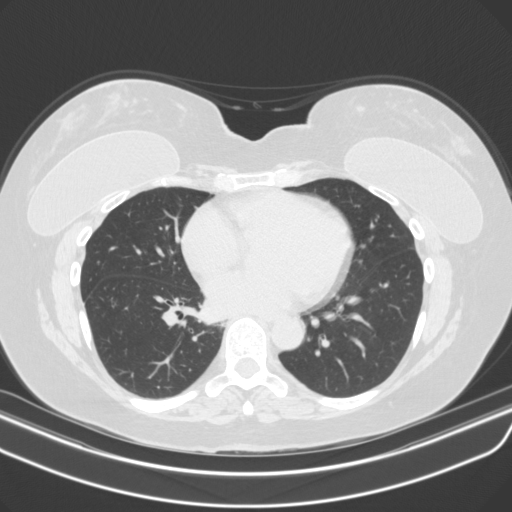
[im 89/171  lung]
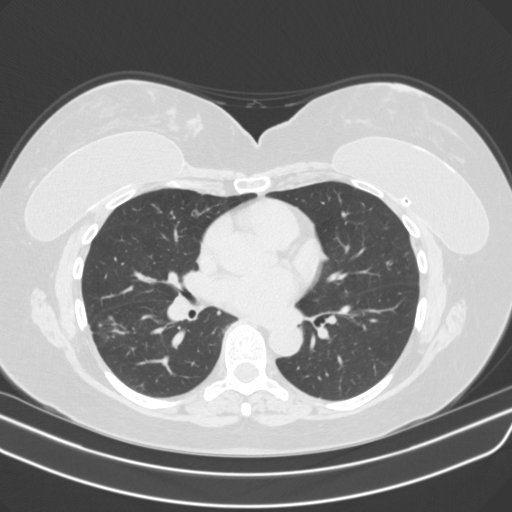
[im 95/171  mediastinal]
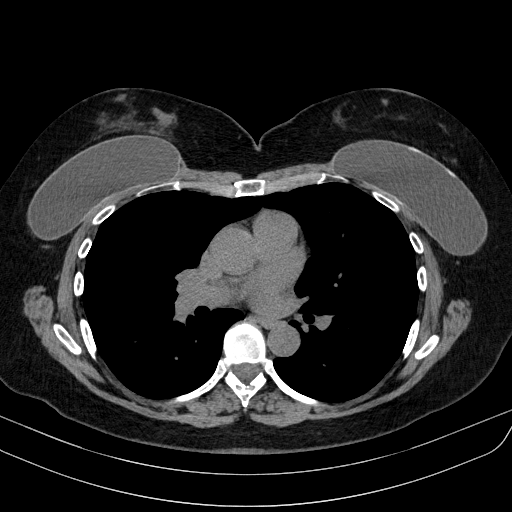
[im 95/171  lung]
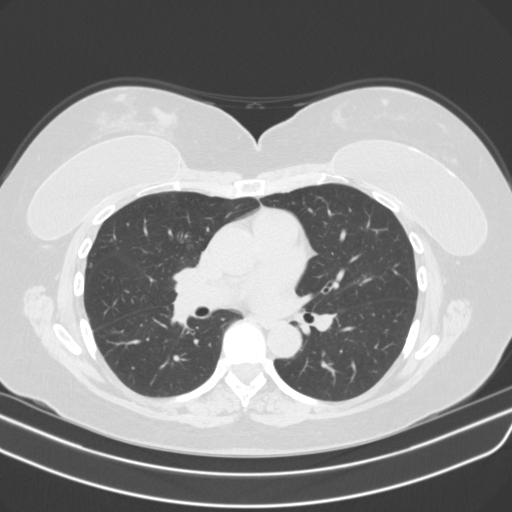
[im 103/171  lung]
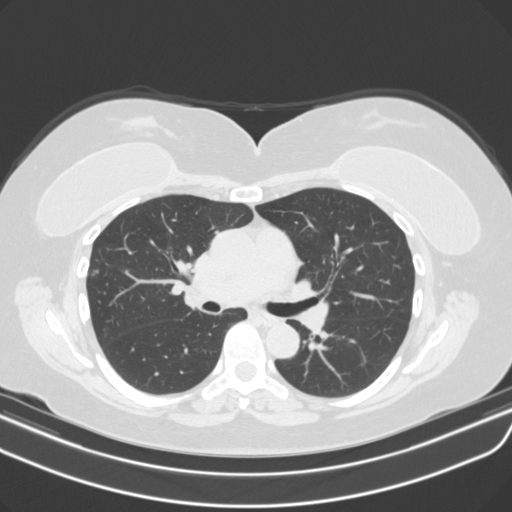
[im 114/171  lung]
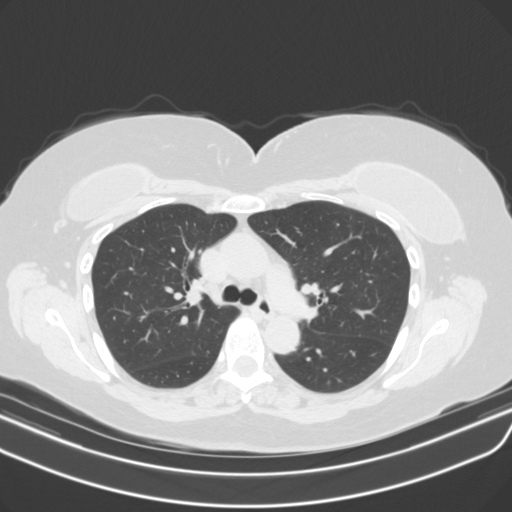
[im 126/171  lung]
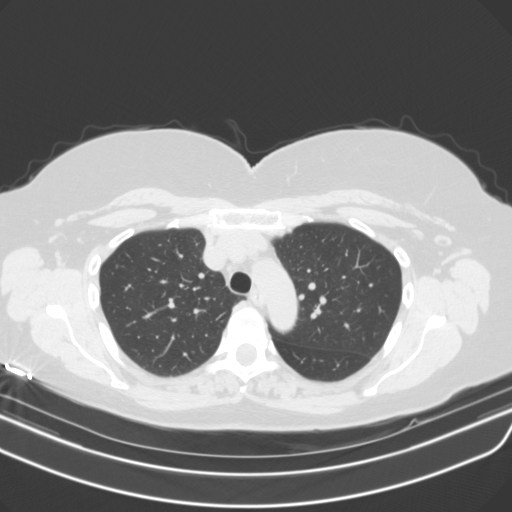
[im 137/171  mediastinal]
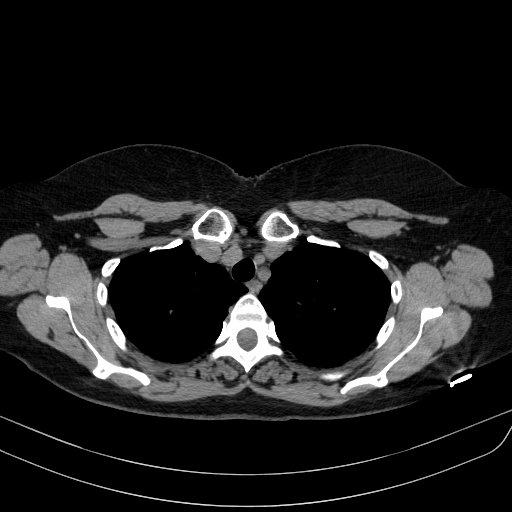
[im 137/171  lung]
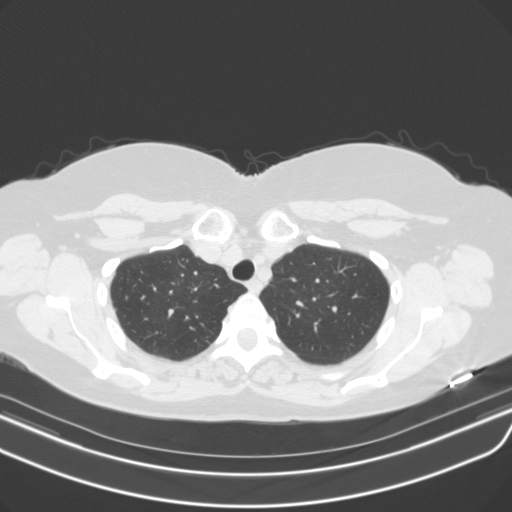
[im 145/171  lung]
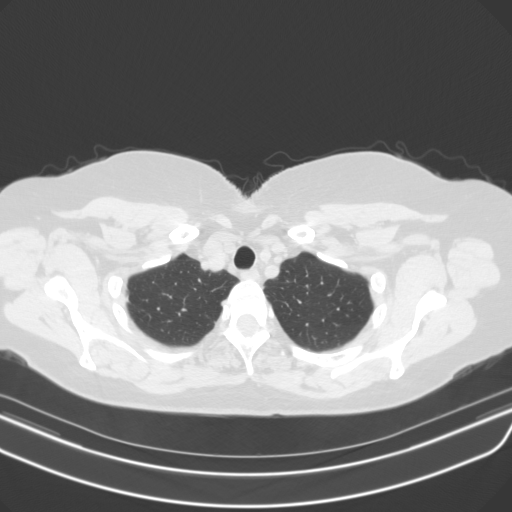
[im 158/171  lung]
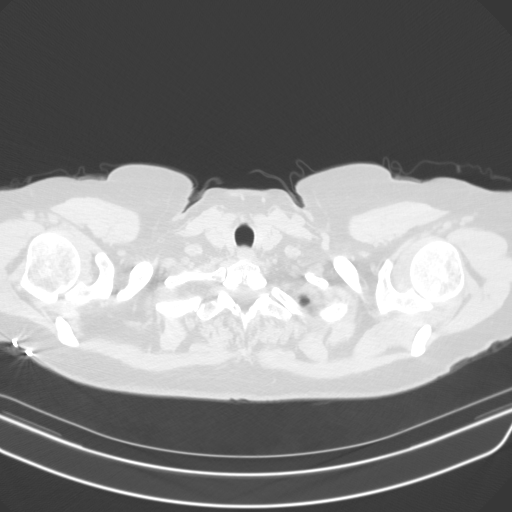

[15 of 34 positions shown; findings below may reference images not displayed]

FINDINGS: Cardiovascular: Scattered aortic atherosclerosis. Normal heart
size. No pericardial effusion.

Mediastinum/Nodes: No enlarged mediastinal, hilar, or axillary lymph
nodes. Thyroid gland, trachea, and esophagus demonstrate no
significant findings.

Lungs/Pleura: There are scattered areas of clustered, centrilobular
and tree-in-bud nodularity throughout the lungs, some of which is
improved compared to prior examination dated 01/26/2020, some of
which is worsened, for example in the peripheral right upper lobe
(series 5, image 73, in the anterior right lower lobe (series 5,
image 90), and in the posterior lingula (series 5, image 91). No
pleural effusion or pneumothorax.

Upper Abdomen: No acute abnormality.

Musculoskeletal: No chest wall mass or suspicious bone lesions
identified. Subpectoral bilateral breast implants.
IMPRESSION: There scattered areas of clustered, centrilobular and tree-in-bud
nodularity throughout the lungs, which are fluctuant in comparison
to examination dated 01/26/2020. Findings are consistent with
ongoing atypical infection, particularly atypical Mycobacterium.

Aortic Atherosclerosis ([id number]).

## 2023-07-10 ENCOUNTER — Other Ambulatory Visit (HOSPITAL_COMMUNITY): Payer: Self-pay

## 2023-07-10 MED ORDER — WEGOVY 2.4 MG/0.75ML ~~LOC~~ SOAJ
2.4000 mg | SUBCUTANEOUS | 3 refills | Status: AC
Start: 2023-07-10 — End: ?
  Filled 2023-07-10: qty 3, 28d supply, fill #0
  Filled 2023-08-05: qty 3, 28d supply, fill #1
  Filled 2024-02-17: qty 3, 28d supply, fill #2
  Filled 2024-03-20 – 2024-03-22 (×2): qty 3, 28d supply, fill #3

## 2023-07-17 ENCOUNTER — Ambulatory Visit
Admission: RE | Admit: 2023-07-17 | Discharge: 2023-07-17 | Disposition: A | Discharge status: Home / Self Care | Payer: 59 | Source: Ambulatory Visit | Attending: Emergency Medicine | Admitting: Emergency Medicine

## 2023-07-17 DIAGNOSIS — J479 Bronchiectasis, uncomplicated: Secondary | ICD-10-CM

## 2023-07-31 ENCOUNTER — Telehealth: Payer: Self-pay | Admitting: Emergency Medicine

## 2023-07-31 NOTE — Telephone Encounter (Signed)
 Received call report from [facility] imaging on CT.  Dr. Delton Coombes, please advise. Thanks

## 2023-07-31 NOTE — Telephone Encounter (Signed)
 Call report

## 2023-07-31 NOTE — Telephone Encounter (Signed)
 I reviewed her scan. She has f/u with me on 08/15/23. We will discuss at that time. Thanks.

## 2023-08-15 ENCOUNTER — Ambulatory Visit: Payer: 59 | Admitting: Emergency Medicine

## 2023-08-15 ENCOUNTER — Encounter: Payer: Self-pay | Admitting: Emergency Medicine

## 2023-08-15 VITALS — BP 107/71 | HR 63 | Ht 65.0 in | Wt 166.6 lb

## 2023-08-15 DIAGNOSIS — R918 Other nonspecific abnormal finding of lung field: Secondary | ICD-10-CM

## 2023-08-15 DIAGNOSIS — R059 Cough, unspecified: Secondary | ICD-10-CM | POA: Insufficient documentation

## 2023-08-15 DIAGNOSIS — R053 Chronic cough: Secondary | ICD-10-CM | POA: Diagnosis not present

## 2023-08-15 NOTE — Progress Notes (Signed)
 Subjective:    Patient ID: Cindy Williamson, female    DOB: January 09, 1961, 63 y.o.   MRN: [id number]  HPI   ROV 08/30/22 --63 year old woman with a history of bronchiectasis, mild patchy tree-in-bud opacities suspicious for [facility] (not proven).  Last seen in November after a repeat CT chest.  No history of autoimmune disease.  Today she reports that she is doing quite well. No dyspnea no cough. She is active.   CT chest 08/23/2022 reviewed by me, shows waxing and waning mild to moderate patchy tree-in-bud nodularity, right greater than left.  Particularly notable in the peripheral right upper lobe, anterior right lower lobe.  Stable minimal cylindrical bronchiectatic change.  ROV 08/15/2023 --follow-up visit 63 year old woman with a history of bronchiectasis and some mild patchy tree-in-bud opacities suspicious for Mycobacterium avium (not proven).  She is not immunosuppressed.  We repeated surveillance CT chest in February. She has a mold exposure at her workplace, has been worked on but may still be present. She had a URI, throat drainage in Jan and Feb. She is not on flonase right now, does take allegra. She has breakthrough GERD takes tagamet on top of her omeprazole. She has dry cough daily  CT scan of the chest 07/17/2023 reviewed by me, shows a more focal area of nodularity in the right lower lobe near the right hemidiaphragm 2.8 x 1.9 cm superimposed on her known cylindrical bronchiectasis and scattered tree-in-bud change.   Review of Systems As per HPI   Past Medical History:  Diagnosis Date   Acute cystitis    Hypertension    Menopausal syndrome    Thyroid disease      Family History  Problem Relation Age of Onset   COPD Father    Atrial fibrillation Brother     Brother >> RA Father >> lung CA  Social History   Socioeconomic History   Marital status: Married    Spouse name: Not on file   Number of children: Not on file   Years of education: Not on file   Highest education  level: Not on file  Occupational History   Not on file  Tobacco Use   Smoking status: Former    Current packs/day: 0.00    Types: Cigarettes    Start date: 04/22/2012    Quit date: 04/22/2017    Years since quitting: 6.3   Smokeless tobacco: Never  Substance and Sexual Activity   Alcohol use: Not on file    Comment: occ   Drug use: No   Sexual activity: Not on file  Other Topics Concern   Not on file  Social History Narrative   Not on file   Social Drivers of Health   Financial Resource Strain: Not on file  Food Insecurity: Not on file  Transportation Needs: Not on file  Physical Activity: Not on file  Stress: Not on file  Social Connections: Not on file  Intimate Partner Violence: Not on file    From [place],  Works in Photographer No inhaled exposures No mold.  Never birds.    Allergies  Allergen Reactions   Penicillins Anaphylaxis   Other      Outpatient Medications Prior to Visit  Medication Sig Dispense Refill   Cyanocobalamin (VITAMIN B-12) 5000 MCG TBDP Take by mouth.     estradiol (ESTRACE) 0.5 MG tablet estradiol 0.5 mg tablet  TAKE 1 TABLET BY MOUTH EVERY DAY     levothyroxine (SYNTHROID) 100 MCG tablet Take 100 mcg by mouth at  bedtime.     medroxyPROGESTERone (PROVERA) 2.5 MG tablet medroxyprogesterone 2.5 mg tablet  TAKE 1 TABLET BY MOUTH EVERY DAY     PARoxetine (PAXIL) 10 MG tablet Take 10 mg by mouth daily.     Semaglutide-Weight Management (WEGOVY) 2.4 MG/0.75ML SOAJ Inject 2.4 mg into the skin once a week. 3 mL 3   VITAMIN D PO Take by mouth.     albuterol (VENTOLIN HFA) 108 (90 Base) MCG/ACT inhaler Inhale 2 puffs into the lungs every 6 (six) hours as needed for wheezing or shortness of breath. (Patient not taking: Reported on 08/15/2023) 8 g 2   benzonatate (TESSALON) 200 MG capsule Take 1 capsule (200 mg total) by mouth 3 (three) times daily as needed for cough. (Patient not taking: Reported on 08/15/2023) 30 capsule 1   fluticasone (FLONASE) 50  MCG/ACT nasal spray Place 2 sprays into both nostrils daily. (Patient not taking: Reported on 08/15/2023) 18.2 mL 2   HYDROMET 5-1.5 MG/5ML syrup Take 5 mLs by mouth 4 (four) times daily as needed. (Patient not taking: Reported on 08/15/2023)     Ibuprofen (ADVIL PO) Take by mouth as needed. (Patient not taking: Reported on 08/15/2023)     Insulin Pen Needle (TECHLITE PEN NEEDLES) 32G X 4 MM MISC Use to inject Saxenda once daily (Patient not taking: Reported on 08/15/2023) 100 each 3   Liraglutide -Weight Management (SAXENDA) 18 MG/3ML SOPN Inject 0.6 mg into the skin daily. Increase by 0.6mg 's every 2 weeks (Patient not taking: Reported on 08/15/2023) 3 mL 5   losartan (COZAAR) 50 MG tablet Take 50 mg by mouth daily. (Patient not taking: Reported on 08/15/2023)     promethazine-dextromethorphan (PROMETHAZINE-DM) 6.25-15 MG/5ML syrup Take 5 mLs by mouth at bedtime as needed for cough. (Patient not taking: Reported on 08/15/2023) 80 mL 0   SAXENDA 18 MG/3ML SOPN Inject 3 mg into the skin daily before breakfast. (Patient not taking: Reported on 08/15/2023) 15 mL 5   Semaglutide-Weight Management (WEGOVY) 1 MG/0.5ML SOAJ Inject 1 mg into the skin once a week. (Patient not taking: Reported on 08/15/2023) 2 mL 0   Semaglutide-Weight Management (WEGOVY) 1.7 MG/0.75ML SOAJ Inject 1.7 mg into the skin once a week. (Patient not taking: Reported on 08/15/2023) 3 mL 1   WEGOVY 1 MG/0.5ML SOAJ Inject 1 mg into the skin once a week. (Patient not taking: Reported on 08/15/2023) 2 mL 5   No facility-administered medications prior to visit.        Objective:   Physical Exam  Vitals:   08/15/23 0830  BP: 107/71  Pulse: 63  SpO2: 100%  Weight: 166 lb 9.6 oz (75.6 kg)  Height: 5\' 5"  (1.651 m)   Gen: Pleasant, well-nourished, in no distress,  normal affect  ENT: No lesions,  mouth clear,  oropharynx clear, no postnasal drip  Neck: No JVD, no stridor  Lungs: No use of accessory muscles, no crackles or wheezing on  normal respiration, no wheeze on forced expiration  Cardiovascular: RRR, heart sounds normal, no murmur or gallops, no peripheral edema  Musculoskeletal: No deformities, no cyanosis or clubbing  Neuro: alert, awake, non focal  Skin: Warm, no lesions or rash     Assessment & Plan:  Pulmonary nodules CT chest has looks like micronodular disease in the past.  Now there is a more focal rounded area at the right base.  We reviewed this today.  May still reflect an inflammatory process but we will do a short-term CT chest in  May.  If the lesion persists or if symptoms increase then we will pursue bronchoscopy to get culture data and also biopsies.  We reviewed your CT scan of the chest today.  We will plan to repeat your CT chest in May 2025.  Follow Dr. Delton Coombes in May after your CT chest so we can review those results together.  Cough Impacted by both active rhinitis and active GERD.  She has some breakthrough GERD on omeprazole and Tagamet.  Plan to increase the omeprazole temporarily and see if she gets benefit.  Also add back her fluticasone nasal spray   Continue your Allegra once daily. Add back your fluticasone nasal spray, 2 sprays each nostril once daily.  Hopefully this will help your drainage and your dry cough Consider increasing your omeprazole to twice a day until next visit to see if this helps with your reflux and its connection to your cough     Levy Pupa, MD, PhD 08/15/2023, 9:07 AM [facility] [phone] or if no answer before 7:00PM call [phone] For any issues after 7:00PM please call eLink [phone]

## 2023-08-15 NOTE — Assessment & Plan Note (Signed)
 CT chest has looks like micronodular disease in the past.  Now there is a more focal rounded area at the right base.  We reviewed this today.  May still reflect an inflammatory process but we will do a short-term CT chest in May.  If the lesion persists or if symptoms increase then we will pursue bronchoscopy to get culture data and also biopsies.  We reviewed your CT scan of the chest today.  We will plan to repeat your CT chest in May 2025.  Follow Dr. Delton Coombes in May after your CT chest so we can review those results together.

## 2023-08-15 NOTE — Assessment & Plan Note (Signed)
 Impacted by both active rhinitis and active GERD.  She has some breakthrough GERD on omeprazole and Tagamet.  Plan to increase the omeprazole temporarily and see if she gets benefit.  Also add back her fluticasone nasal spray   Continue your Allegra once daily. Add back your fluticasone nasal spray, 2 sprays each nostril once daily.  Hopefully this will help your drainage and your dry cough Consider increasing your omeprazole to twice a day until next visit to see if this helps with your reflux and its connection to your cough

## 2023-08-15 NOTE — Patient Instructions (Signed)
 We reviewed your CT scan of the chest today.  We will plan to repeat your CT chest in May 2025.  Continue your Allegra once daily. Add back your fluticasone nasal spray, 2 sprays each nostril once daily.  Hopefully this will help your drainage and your dry cough Consider increasing your omeprazole to twice a day until next visit to see if this helps with your reflux and its connection to your cough Follow Dr. Delton Coombes in May after your CT chest so we can review those results together.

## 2023-09-03 ENCOUNTER — Other Ambulatory Visit (HOSPITAL_COMMUNITY): Payer: Self-pay

## 2023-09-03 MED ORDER — WEGOVY 2.4 MG/0.75ML ~~LOC~~ SOAJ
2.4000 mg | SUBCUTANEOUS | 3 refills | Status: AC
  Filled 2023-09-03: qty 3, 28d supply, fill #0
  Filled 2023-09-29: qty 3, 28d supply, fill #1
  Filled 2024-05-14: qty 3, 28d supply, fill #2
  Filled 2024-06-13: qty 3, 28d supply, fill #3

## 2023-09-25 ENCOUNTER — Other Ambulatory Visit: Payer: Self-pay | Admitting: Obstetrics & Gynecology

## 2023-09-25 DIAGNOSIS — Z0000 Encounter for general adult medical examination without abnormal findings: Secondary | ICD-10-CM

## 2023-10-08 ENCOUNTER — Other Ambulatory Visit

## 2023-10-15 ENCOUNTER — Ambulatory Visit
Admission: RE | Admit: 2023-10-15 | Discharge: 2023-10-15 | Disposition: A | Discharge status: Home / Self Care | Source: Ambulatory Visit | Attending: Emergency Medicine | Admitting: Emergency Medicine

## 2023-10-15 DIAGNOSIS — R918 Other nonspecific abnormal finding of lung field: Secondary | ICD-10-CM

## 2023-10-16 IMAGING — US US ABDOMEN LIMITED
1 series · 15 of 25 positions shown · non-contrast
Comparison: None.

CLINICAL DATA: Right upper quadrant abdominal pain

EXAM:
ULTRASOUND ABDOMEN LIMITED RIGHT UPPER QUADRANT

[Series 1: us abdomen limited ruq mc & wl · 15 of 47 slices shown]
[im 1/47]
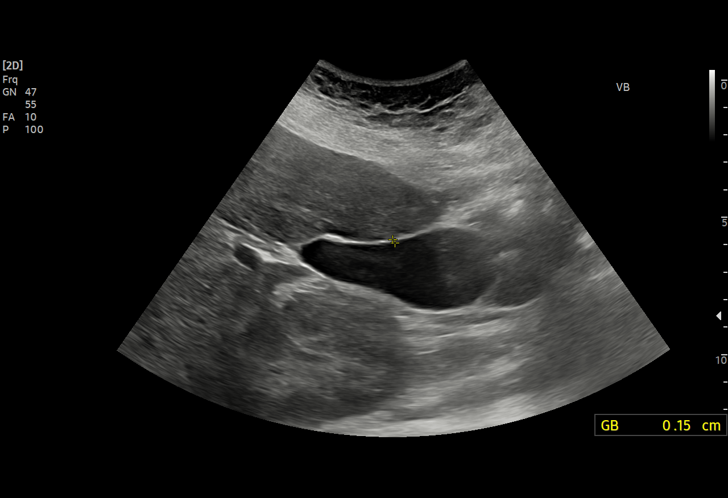
[im 4/47]
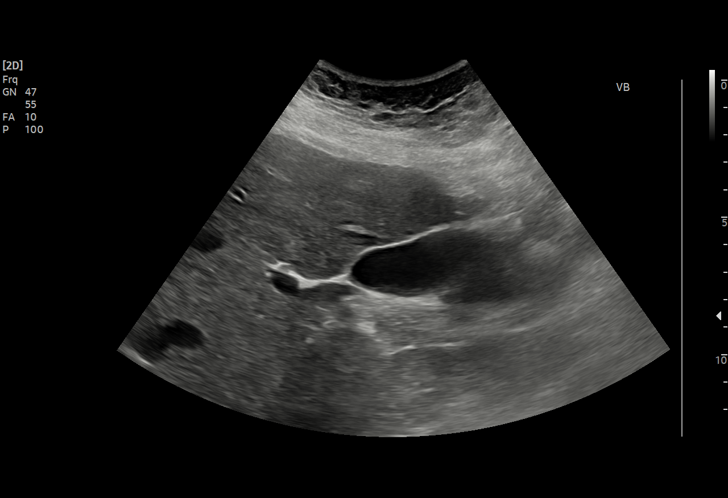
[im 8/47]
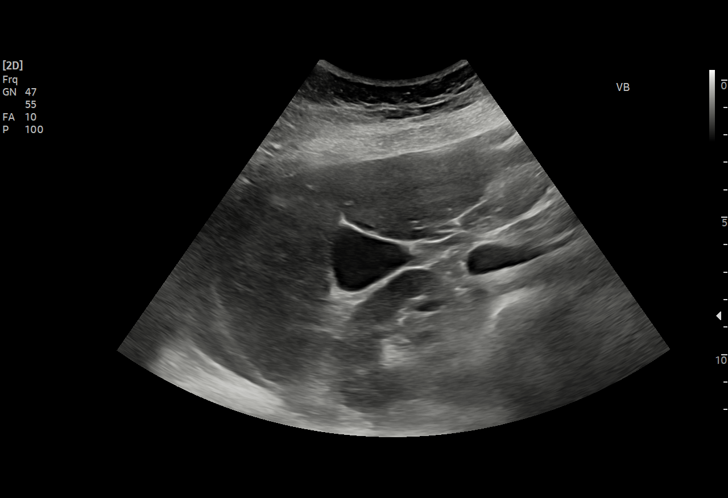
[im 10/47]
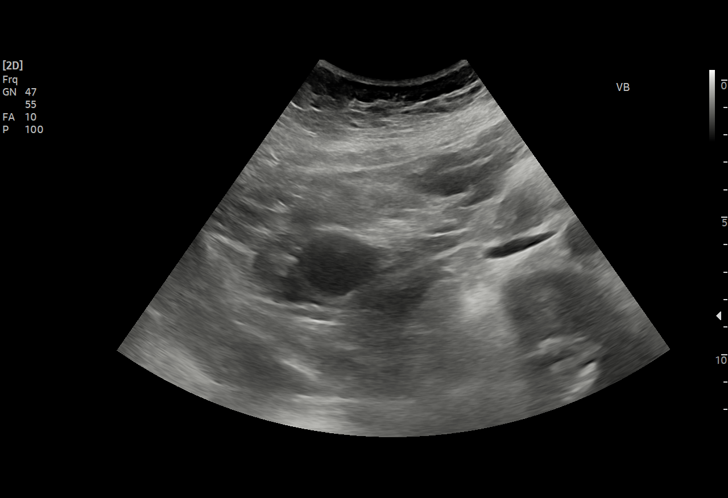
[im 14/47]
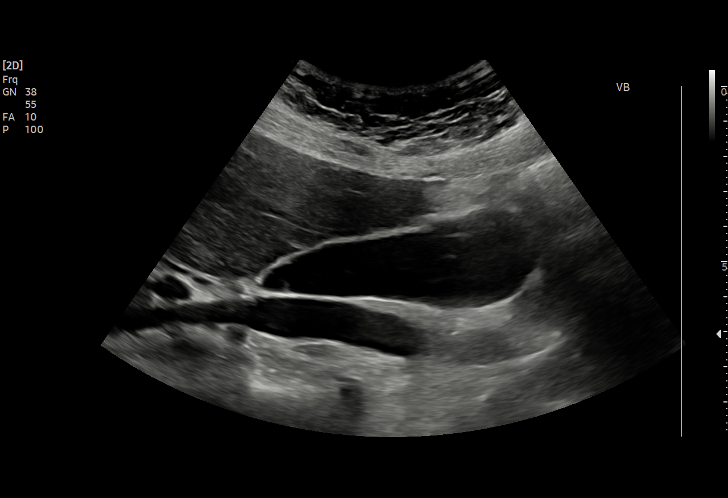
[im 18/47]
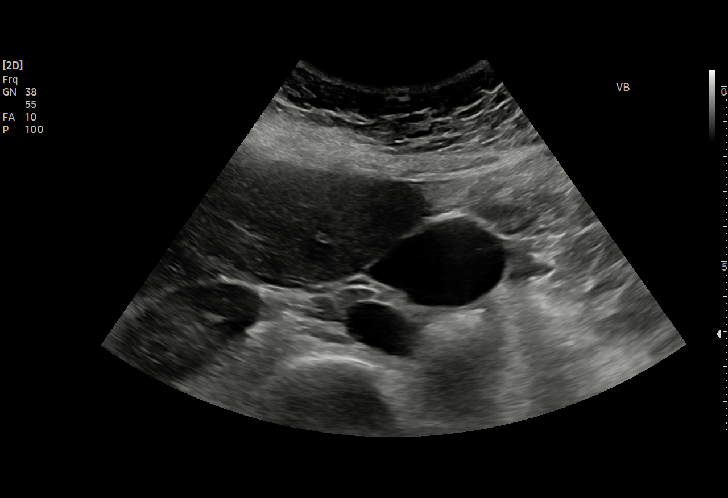
[im 20/47]
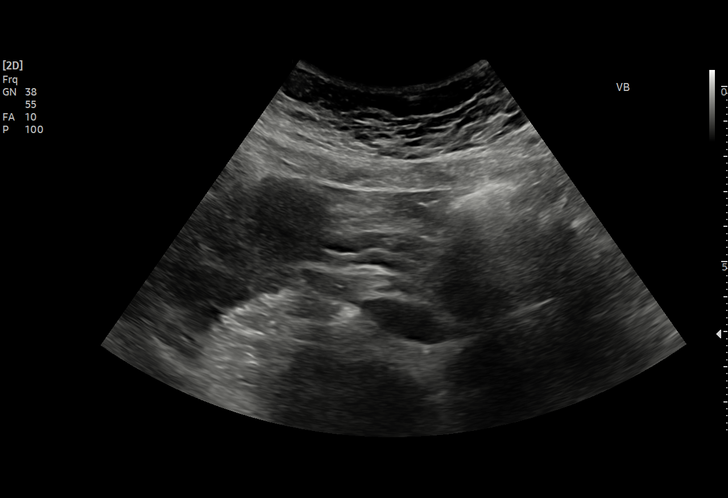
[im 24/47]
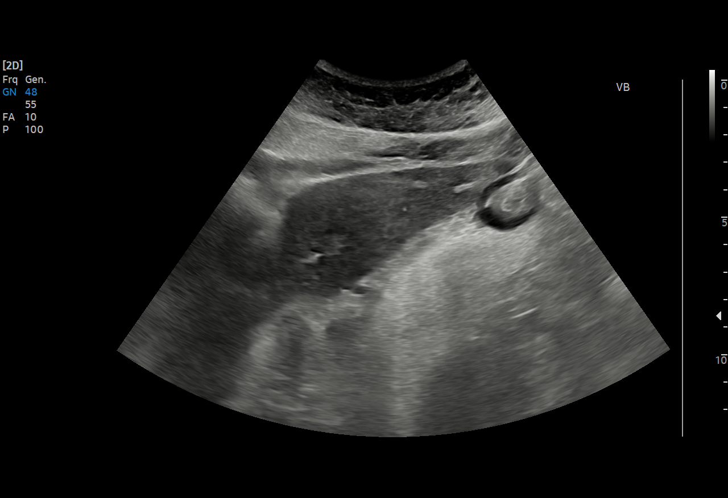
[im 27/47]
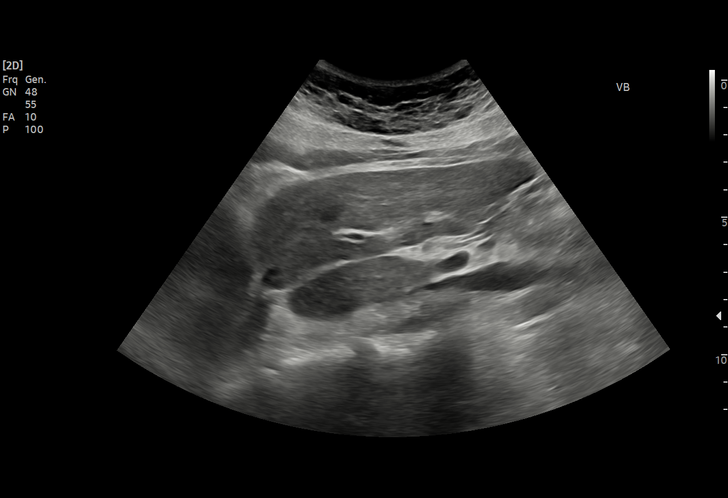
[im 29/47]
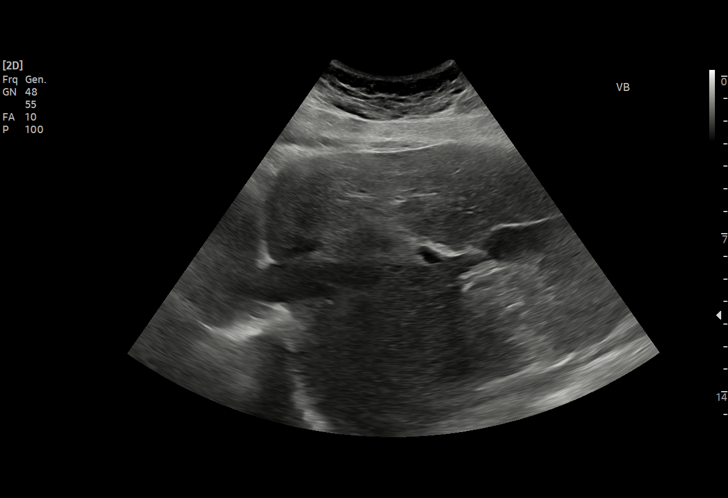
[im 33/47]
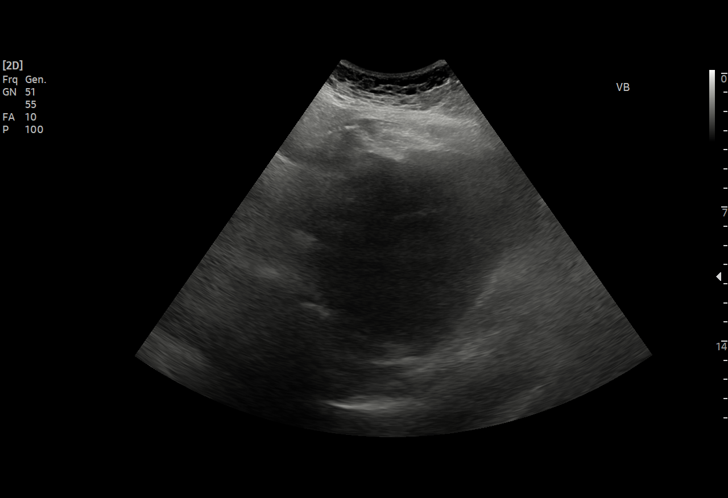
[im 37/47]
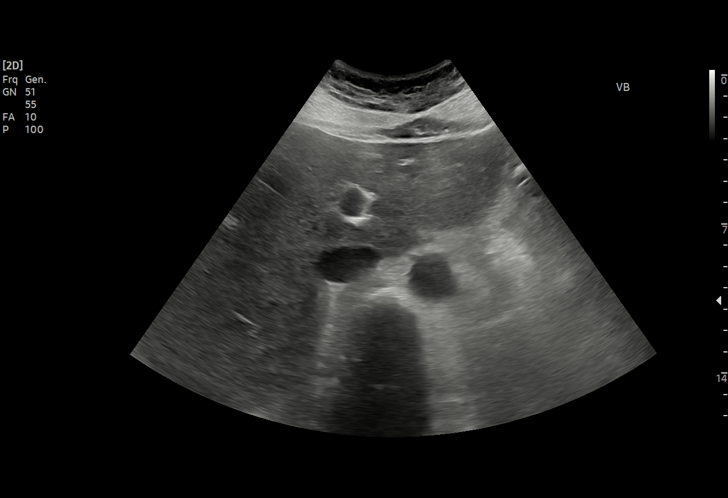
[im 39/47]
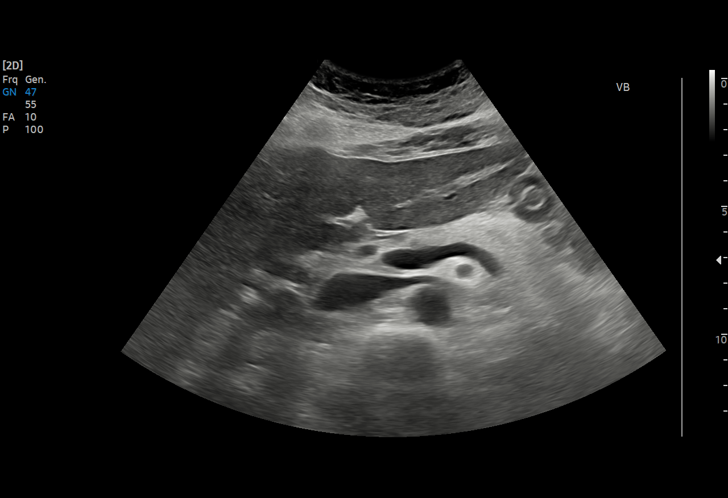
[im 43/47]
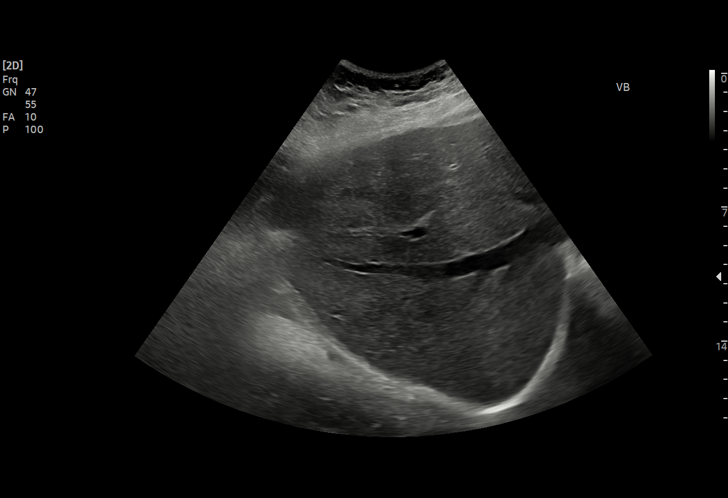
[im 47/47]
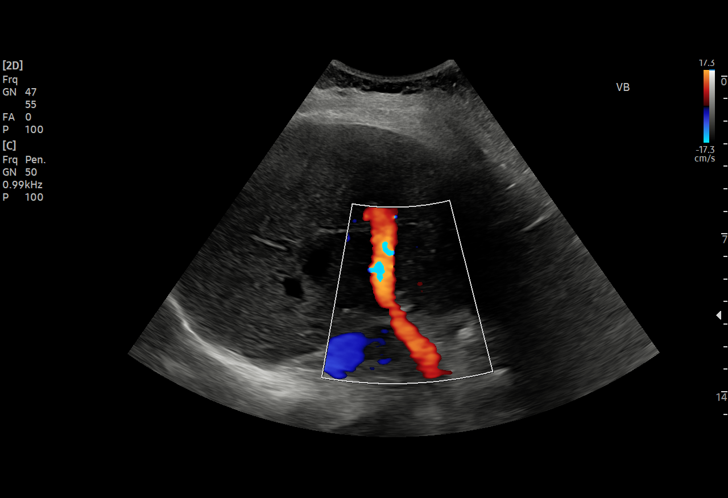

[15 of 25 positions shown; findings below may reference images not displayed]

FINDINGS: Gallbladder:

No gallstones or wall thickening visualized. No sonographic Murphy
sign noted by sonographer.

Common bile duct:

Diameter: 4.9 mm.  Normal right

Liver:

No focal lesion identified. Within normal limits in parenchymal
echogenicity. Portal vein is patent on color Doppler imaging with
normal direction of blood flow towards the liver.

Other: None.
IMPRESSION: Upper quadrant ultrasound.

## 2023-10-23 ENCOUNTER — Other Ambulatory Visit (HOSPITAL_COMMUNITY): Payer: Self-pay

## 2023-10-23 MED ORDER — WEGOVY 2.4 MG/0.75ML ~~LOC~~ SOAJ
2.4000 mg | SUBCUTANEOUS | 1 refills | Status: AC
  Filled 2023-10-23: qty 3, 28d supply, fill #0
  Filled 2023-11-27: qty 3, 28d supply, fill #1

## 2023-10-24 ENCOUNTER — Encounter: Payer: Self-pay | Admitting: Emergency Medicine

## 2023-10-24 ENCOUNTER — Ambulatory Visit: Admitting: Emergency Medicine

## 2023-10-24 VITALS — BP 127/84 | HR 72 | Ht 65.0 in | Wt 169.6 lb

## 2023-10-24 DIAGNOSIS — R918 Other nonspecific abnormal finding of lung field: Secondary | ICD-10-CM

## 2023-10-24 DIAGNOSIS — R053 Chronic cough: Secondary | ICD-10-CM

## 2023-10-24 NOTE — Patient Instructions (Signed)
 We reviewed your CT scan of the chest today.  This has improved compared with your prior.  Good news. We will plan to repeat your CT scan of the chest in November 2025. Continue to use your omeprazole 1-2 times daily depending on your reflux and dry cough Continue your Allegra as needed Follow Dr. Baldwin Levee in November, please call sooner if you develop any new issues or have any questions.

## 2023-10-24 NOTE — Progress Notes (Signed)
 Subjective:    Patient ID: Cindy Williamson, female    DOB: [birth year], 63 y.o.   MRN: [id number]  HPI   ROV 08/15/2023 --follow-up visit 63 year old woman with a history of bronchiectasis and some mild patchy tree-in-bud opacities suspicious for Mycobacterium avium (not proven).  She is not immunosuppressed.  We repeated surveillance CT chest in February. She has a mold exposure at her workplace, has been worked on but may still be present. She had a URI, throat drainage in Jan and Feb. She is not on flonase  right now, does take allegra. She has breakthrough GERD takes tagamet on top of her omeprazole. She has dry cough daily  CT scan of the chest 07/17/2023 reviewed by me, shows a more focal area of nodularity in the right lower lobe near the right hemidiaphragm 2.8 x 1.9 cm superimposed on her known cylindrical bronchiectasis and scattered tree-in-bud change.  ROV 10/24/2023 --Cindy Williamson is here for follow-up today.  She is 63 with a history of bronchiectasis and some scattered tree-in-bud nodularity on chest imaging.  This is suspicious for possible mycobacterial disease (not proven) no significant risk factors for this, no immunosuppression.  She also deals with chronic dry cough in the setting of GERD, rhinitis.  At her last visit we temporarily increased her omeprazole, added back fluticasone  nasal spray to Allegra to see if she would get benefit.  We repeated her CT scan of the chest to evaluate her micronodular disease as well as a more focal rounded area at the right base. She is having some allergy and cough - using allegra only prn, no nasal steroid.   CT scan of the chest 10/15/2023 reviewed by me, shows   Review of Systems As per HPI   Past Medical History:  Diagnosis Date   Acute cystitis    Hypertension    Menopausal syndrome    Thyroid  disease      Family History  Problem Relation Age of Onset   COPD Father    Atrial fibrillation Brother     Brother >> RA Father >> lung  CA  Social History   Socioeconomic History   Marital status: Married    Spouse name: Not on file   Number of children: Not on file   Years of education: Not on file   Highest education level: Not on file  Occupational History   Not on file  Tobacco Use   Smoking status: Former    Current packs/day: 0.00    Types: Cigarettes    Start date: 04/22/2012    Quit date: 04/22/2017    Years since quitting: 6.5   Smokeless tobacco: Never  Substance and Sexual Activity   Alcohol use: Not on file    Comment: occ   Drug use: No   Sexual activity: Not on file  Other Topics Concern   Not on file  Social History Narrative   Not on file   Social Drivers of Health   Financial Resource Strain: Not on file  Food Insecurity: Not on file  Transportation Needs: Not on file  Physical Activity: Not on file  Stress: Not on file  Social Connections: Not on file  Intimate Partner Violence: Not on file    From [place],  Works in Photographer No inhaled exposures No mold.  Never birds.    Allergies  Allergen Reactions   Penicillins Anaphylaxis   Other      Outpatient Medications Prior to Visit  Medication Sig Dispense Refill   Cyanocobalamin (VITAMIN B-12)  5000 MCG TBDP Take by mouth.     Ibuprofen (ADVIL PO) Take by mouth as needed.     levothyroxine (SYNTHROID) 100 MCG tablet Take 100 mcg by mouth at bedtime.     losartan (COZAAR) 50 MG tablet Take 50 mg by mouth daily.     PARoxetine (PAXIL) 10 MG tablet Take 10 mg by mouth daily.     Semaglutide -Weight Management (WEGOVY ) 2.4 MG/0.75ML SOAJ Inject 2.4 mg into the skin once a week. 3 mL 1   VITAMIN D PO Take by mouth.     albuterol  (VENTOLIN  HFA) 108 (90 Base) MCG/ACT inhaler Inhale 2 puffs into the lungs every 6 (six) hours as needed for wheezing or shortness of breath. (Patient not taking: Reported on 10/24/2023) 8 g 2   benzonatate  (TESSALON ) 200 MG capsule Take 1 capsule (200 mg total) by mouth 3 (three) times daily as needed for cough.  (Patient not taking: Reported on 10/24/2023) 30 capsule 1   estradiol (ESTRACE) 0.5 MG tablet estradiol 0.5 mg tablet  TAKE 1 TABLET BY MOUTH EVERY DAY (Patient not taking: Reported on 10/24/2023)     fluticasone  (FLONASE ) 50 MCG/ACT nasal spray Place 2 sprays into both nostrils daily. (Patient not taking: Reported on 10/24/2023) 18.2 mL 2   HYDROMET 5-1.5 MG/5ML syrup Take 5 mLs by mouth 4 (four) times daily as needed. (Patient not taking: Reported on 10/24/2023)     Insulin  Pen Needle (TECHLITE PEN NEEDLES) 32G X 4 MM MISC Use to inject Saxenda  once daily (Patient not taking: Reported on 10/24/2023) 100 each 3   Liraglutide  -Weight Management (SAXENDA ) 18 MG/3ML SOPN Inject 0.6 mg into the skin daily. Increase by 0.6mg 's every 2 weeks (Patient not taking: Reported on 10/24/2023) 3 mL 5   medroxyPROGESTERone (PROVERA) 2.5 MG tablet medroxyprogesterone 2.5 mg tablet  TAKE 1 TABLET BY MOUTH EVERY DAY (Patient not taking: Reported on 10/24/2023)     promethazine -dextromethorphan (PROMETHAZINE -DM) 6.25-15 MG/5ML syrup Take 5 mLs by mouth at bedtime as needed for cough. (Patient not taking: Reported on 10/24/2023) 80 mL 0   SAXENDA  18 MG/3ML SOPN Inject 3 mg into the skin daily before breakfast. (Patient not taking: Reported on 10/24/2023) 15 mL 5   Semaglutide -Weight Management (WEGOVY ) 1 MG/0.5ML SOAJ Inject 1 mg into the skin once a week. (Patient not taking: Reported on 10/24/2023) 2 mL 0   Semaglutide -Weight Management (WEGOVY ) 1.7 MG/0.75ML SOAJ Inject 1.7 mg into the skin once a week. (Patient not taking: Reported on 10/24/2023) 3 mL 1   Semaglutide -Weight Management (WEGOVY ) 2.4 MG/0.75ML SOAJ Inject 2.4 mg into the skin once a week. (Patient not taking: Reported on 10/24/2023) 3 mL 3   Semaglutide -Weight Management (WEGOVY ) 2.4 MG/0.75ML SOAJ Inject 2.4 mg into the skin once a week. (Patient not taking: Reported on 10/24/2023) 3 mL 3   WEGOVY  1 MG/0.5ML SOAJ Inject 1 mg into the skin once a week. (Patient not  taking: Reported on 10/24/2023) 2 mL 5   No facility-administered medications prior to visit.        Objective:   Physical Exam  Vitals:   10/24/23 0849  BP: 127/84  Pulse: 72  SpO2: 99%  Weight: 169 lb 9.6 oz (76.9 kg)  Height: 5\' 5"  (1.651 m)   Gen: Pleasant, well-nourished, in no distress,  normal affect  ENT: No lesions,  mouth clear,  oropharynx clear, no postnasal drip  Neck: No JVD, no stridor  Lungs: No use of accessory muscles, no crackles or wheezing on normal  respiration, no wheeze on forced expiration  Cardiovascular: RRR, heart sounds normal, no murmur or gallops, no peripheral edema  Musculoskeletal: No deformities, no cyanosis or clubbing  Neuro: alert, awake, non focal  Skin: Warm, no lesions or rash     Assessment & Plan:  Pulmonary nodules Scattered pulmonary nodule disease but with an irregular focal area at the right base.  Smaller on her scan from 10/15/2023 which is reassuring.  We will continue to follow with serial imaging.  If there is radiographical or clinical change then we will revisit bronchoscopy.  Cough Continue to treat GERD and allergic rhinitis.  She has been able to come off of most of her rhinitis medication even through the allergy season.  She does still use omeprazole 1-2 times daily depending on what she eats.  Also Tagamet as needed.      Cindy Buddle, MD, PhD 10/24/2023, 9:18 AM [facility] [phone] or if no answer before 7:00PM call [phone] For any issues after 7:00PM please call eLink [phone]

## 2023-10-24 NOTE — Assessment & Plan Note (Signed)
 Continue to treat GERD and allergic rhinitis.  She has been able to come off of most of her rhinitis medication even through the allergy season.  She does still use omeprazole 1-2 times daily depending on what she eats.  Also Tagamet as needed.

## 2023-10-24 NOTE — Assessment & Plan Note (Signed)
 Scattered pulmonary nodule disease but with an irregular focal area at the right base.  Smaller on her scan from 10/15/2023 which is reassuring.  We will continue to follow with serial imaging.  If there is radiographical or clinical change then we will revisit bronchoscopy.

## 2023-11-06 ENCOUNTER — Ambulatory Visit

## 2023-11-08 NOTE — Progress Notes (Unsigned)
 Cardiology Office Note:   Date:  11/09/2023  ID:  Cindy Williamson, DOB 1960/12/06, MRN [id number] PCP: Bonita Bussing, MD  [facility] Providers Cardiologist:  None {  History of Present Illness:   Cindy Williamson is a 63 y.o. female who follows up for evaluation of tachycardia.  There was a question in the past of possible flutter .  There was some triggers probably for her tachycardia previously including volume, alcohol and caffeine.  She has had some increased heart rate.  She had a little dizziness when she gets up in the morning.  She has to sit there for a while.  She was at work and heart rate started going up.  Stayed up for that an hour and she had to drink water until she finally had it calm down.  She had the point get lightheaded with this.  She has not had any frank syncope.  She is not having any chest pressure, neck or arm discomfort.  She is not having any new shortness of breath, PND or orthopnea.  She has lost about 30 pounds on GLP-1.  ROS: As stated in the HPI and negative for all other systems.  Studies Reviewed:    EKG:   EKG Interpretation Date/Time:  Friday November 09 2023 09:02:23 EDT Ventricular Rate:  61 PR Interval:  110 QRS Duration:  82 QT Interval:  420 QTC Calculation: 422 R Axis:   44  Text Interpretation: Sinus rhythm with short PR RSR' or QR pattern in V1 suggests right ventricular conduction delay Poor anterior R wave progresison When compared with ECG of 27-Sep-2020 23:30, No significant change since last tracing Confirmed by Eilleen Grates ([id number]) on 11/09/2023 9:07:38 AM    Risk Assessment/Calculations:              Physical Exam:   VS:  BP 110/74 (BP Location: Right Arm, Patient Position: Sitting, Cuff Size: Normal)   Pulse 61   Ht 5\' 5"  (1.651 m)   Wt 168 lb 8 oz (76.4 kg)   SpO2 97%   BMI 28.04 kg/m    Wt Readings from Last 3 Encounters:  11/09/23 168 lb 8 oz (76.4 kg)  10/24/23 169 lb 9.6 oz (76.9 kg)  08/15/23 166 lb 9.6 oz (75.6  kg)     GEN: Well nourished, well developed in no acute distress NECK: No JVD; No carotid bruits CARDIAC: RRR, no murmurs, rubs, gallops RESPIRATORY:  Clear to auscultation without rales, wheezing or rhonchi  ABDOMEN: Soft, non-tender, non-distended EXTREMITIES:  No edema; No deformity   ASSESSMENT AND PLAN:   HTN:   Her blood pressure is running a little bit low and she is gena keep an eye on this.  If the systolics consistently in the 110 range we would probably be able to go down on her losartan.    ATRIAL FLUTTER   I do not see any documented recurrence of this but going to have her wear a 2-week monitor and then she is going to get a wearable.  For now no change in therapy.  She is going to get blood work done soon.  She did have a TSH last December.   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  She had a follow-up CT in May 2025.     REFLUX: She is going to see GI for colonoscopy and she is having a lot of reflux and she is going to talk to them about this.  AORTIC ATHEROSCLEROSIS: He does have this noted on the CT and we talked about risk reduction.  She is going to get a lipid profile and I would suggest a goal LDL less than 100.     Follow up with me as needed.   Signed, Eilleen Grates, MD

## 2023-11-09 ENCOUNTER — Encounter: Payer: Self-pay | Admitting: Cardiology

## 2023-11-09 ENCOUNTER — Ambulatory Visit: Attending: Cardiology | Admitting: Cardiology

## 2023-11-09 VITALS — BP 110/74 | HR 61 | Ht 65.0 in | Wt 168.5 lb

## 2023-11-09 DIAGNOSIS — I4892 Unspecified atrial flutter: Secondary | ICD-10-CM

## 2023-11-09 DIAGNOSIS — I10 Essential (primary) hypertension: Secondary | ICD-10-CM

## 2023-11-09 DIAGNOSIS — R918 Other nonspecific abnormal finding of lung field: Secondary | ICD-10-CM

## 2023-11-09 NOTE — Patient Instructions (Signed)
 Medication Instructions:  Your physician recommends that you continue on your current medications as directed. Please refer to the Current Medication list given to you today.  *If you need a refill on your cardiac medications before your next appointment, please call your pharmacy*  Lab Work: NONE If you have labs (blood work) drawn today and your tests are completely normal, you will receive your results only by: MyChart Message (if you have MyChart) OR A paper copy in the mail If you have any lab test that is abnormal or we need to change your treatment, we will call you to review the results.  Testing/Procedures: 14 Day Zio Heart Monitor Your physician has requested that you wear a Zio heart monitor for 14 days. This will be mailed to your home with instructions on how to apply the monitor and how to return it when finished. Please allow 2 weeks after returning the heart monitor before our office calls you with the results.   Follow-Up: At [facility], you and your health needs are our priority.  As part of our continuing mission to provide you with exceptional heart care, our providers are all part of one team.  This team includes your primary Cardiologist (physician) and Advanced Practice Providers or APPs (Physician Assistants and Nurse Practitioners) who all work together to provide you with the care you need, when you need it.  Your next appointment:   As needed  Provider:   Lavonne Williamson, Cindy Williamson  We recommend signing up for the patient portal called "MyChart".  Sign up information is provided on this After Visit Summary.  MyChart is used to connect with patients for Virtual Visits (Telemedicine).  Patients are able to view lab/test results, encounter notes, upcoming appointments, etc.  Non-urgent messages can be sent to your provider as well.   To learn more about what you can do with MyChart, go to [email or web address].   Other Instructions ZIO XT- Long Term Monitor  Instructions  Your physician has requested you wear a ZIO patch monitor for 14 days.  This is a single patch monitor. [employer] supplies one patch monitor per enrollment. Additional stickers are not available. Please do not apply patch if you will be having a Nuclear Stress Test,  Echocardiogram, Cardiac CT, MRI, or Chest Xray during the period you would be wearing the  monitor. The patch cannot be worn during these tests. You cannot remove and re-apply the  ZIO XT patch monitor.  Your ZIO patch monitor will be mailed 3 day USPS to your address on file. It may take 3-5 days  to receive your monitor after you have been enrolled.  Once you have received your monitor, please review the enclosed instructions. Your monitor  has already been registered assigning a specific monitor serial # to you.  Billing and Patient Assistance Program Information  We have supplied [employer] with any of your insurance information on file for billing purposes. [employer] offers a sliding scale Patient Assistance Program for patients that do not have  insurance, or whose insurance does not completely cover the cost of the ZIO monitor.  You must apply for the Patient Assistance Program to qualify for this discounted rate.  To apply, please call [employer] at [phone], select option 4, select option 2, ask to apply for  Patient Assistance Program. Cindy Williamson will ask your household income, and how many people  are in your household. They will quote your out-of-pocket cost based on that information.  [employer] will also be able to set  up a 25-month, interest-free payment plan if needed.  Applying the monitor   Shave hair from upper left chest.  Hold abrader disc by orange tab. Rub abrader in 40 strokes over the upper left chest as  indicated in your monitor instructions.  Clean area with 4 enclosed alcohol pads. Let dry.  Apply patch as indicated in monitor instructions. Patch will be placed under collarbone on left  side  of chest with arrow pointing upward.  Rub patch adhesive wings for 2 minutes. Remove white label marked "1". Remove the white  label marked "2". Rub patch adhesive wings for 2 additional minutes.  While looking in a mirror, press and release button in center of patch. A small green light will  flash 3-4 times. This will be your only indicator that the monitor has been turned on.  Do not shower for the first 24 hours. You may shower after the first 24 hours.  Press the button if you feel a symptom. You will hear a small click. Record Date, Time and  Symptom in the Patient Logbook.  When you are ready to remove the patch, follow instructions on the last 2 pages of Patient  Logbook. Stick patch monitor onto the last page of Patient Logbook.  Place Patient Logbook in the blue and white box. Use locking tab on box and tape box closed  securely. The blue and white box has prepaid postage on it. Please place it in the mailbox as  soon as possible. Your physician should have your test results approximately 7 days after the  monitor has been mailed back to [facility].  Call [facility] Customer Care at [phone] if you have questions regarding  your ZIO XT patch monitor. Call them immediately if you see an orange light blinking on your  monitor.  If your monitor falls off in less than 4 days, contact our Monitor department at [phone].  If your monitor becomes loose or falls off after 4 days call [employer] at [phone] for  suggestions on securing your monitor

## 2023-11-12 ENCOUNTER — Ambulatory Visit: Attending: Cardiology

## 2023-11-12 DIAGNOSIS — I4892 Unspecified atrial flutter: Secondary | ICD-10-CM

## 2023-11-12 NOTE — Progress Notes (Unsigned)
 Enrolled for [employer] to mail a ZIO XT long term holter monitor to the patients address on file.

## 2023-11-13 ENCOUNTER — Ambulatory Visit: Payer: Self-pay | Admitting: Cardiology

## 2023-11-13 ENCOUNTER — Ambulatory Visit
Admission: RE | Admit: 2023-11-13 | Discharge: 2023-11-13 | Disposition: A | Discharge status: Home / Self Care | Source: Ambulatory Visit | Attending: Obstetrics & Gynecology | Admitting: Obstetrics & Gynecology

## 2023-11-13 DIAGNOSIS — Z0000 Encounter for general adult medical examination without abnormal findings: Secondary | ICD-10-CM

## 2023-11-13 LAB — LAB REPORT - SCANNED
A1c: 5.2
EGFR: 99

## 2023-12-25 ENCOUNTER — Other Ambulatory Visit (HOSPITAL_COMMUNITY): Payer: Self-pay

## 2023-12-25 MED ORDER — WEGOVY 2.4 MG/0.75ML ~~LOC~~ SOAJ
2.4000 mg | SUBCUTANEOUS | 5 refills | Status: AC
  Filled 2023-12-25: qty 3, 28d supply, fill #0
  Filled 2024-01-23: qty 3, 28d supply, fill #1
  Filled 2024-04-14: qty 3, 28d supply, fill #2
  Filled 2024-06-10: qty 3, 28d supply, fill #3

## 2024-01-23 ENCOUNTER — Other Ambulatory Visit (HOSPITAL_COMMUNITY): Payer: Self-pay

## 2024-02-18 ENCOUNTER — Other Ambulatory Visit: Payer: Self-pay

## 2024-03-07 ENCOUNTER — Other Ambulatory Visit: Payer: Self-pay | Admitting: Internal Medicine

## 2024-03-07 DIAGNOSIS — R1011 Right upper quadrant pain: Secondary | ICD-10-CM

## 2024-03-11 ENCOUNTER — Other Ambulatory Visit

## 2024-03-21 ENCOUNTER — Encounter (HOSPITAL_COMMUNITY): Payer: Self-pay

## 2024-03-21 ENCOUNTER — Other Ambulatory Visit (HOSPITAL_COMMUNITY): Payer: Self-pay

## 2024-03-25 ENCOUNTER — Ambulatory Visit
Admission: RE | Admit: 2024-03-25 | Discharge: 2024-03-25 | Disposition: A | Source: Ambulatory Visit | Attending: Internal Medicine | Admitting: Internal Medicine

## 2024-03-25 DIAGNOSIS — R1011 Right upper quadrant pain: Secondary | ICD-10-CM

## 2024-04-14 ENCOUNTER — Other Ambulatory Visit: Payer: Self-pay

## 2024-04-25 ENCOUNTER — Ambulatory Visit
Admission: RE | Admit: 2024-04-25 | Discharge: 2024-04-25 | Disposition: A | Discharge status: Home / Self Care | Source: Ambulatory Visit | Attending: Emergency Medicine | Admitting: Emergency Medicine

## 2024-04-25 DIAGNOSIS — R918 Other nonspecific abnormal finding of lung field: Secondary | ICD-10-CM

## 2024-06-11 ENCOUNTER — Ambulatory Visit: Admitting: Emergency Medicine

## 2024-06-11 ENCOUNTER — Other Ambulatory Visit (HOSPITAL_COMMUNITY): Payer: Self-pay

## 2024-06-13 ENCOUNTER — Encounter (HOSPITAL_COMMUNITY): Payer: Self-pay

## 2024-06-13 ENCOUNTER — Other Ambulatory Visit (HOSPITAL_COMMUNITY): Payer: Self-pay

## 2024-06-16 ENCOUNTER — Other Ambulatory Visit (HOSPITAL_COMMUNITY): Payer: Self-pay

## 2024-06-20 ENCOUNTER — Ambulatory Visit: Payer: Self-pay | Admitting: Emergency Medicine

## 2024-07-02 ENCOUNTER — Other Ambulatory Visit: Payer: Self-pay

## 2024-07-17 ENCOUNTER — Ambulatory Visit: Admitting: Emergency Medicine
